# Patient Record
Sex: Male | Born: 1960 | Race: White | Hispanic: No | Smoking: Current every day smoker
Health system: Southern US, Community
[De-identification: ages and names within clinical notes are randomized; demographics above are authoritative.]

## PROBLEM LIST (undated history)

## (undated) DIAGNOSIS — N189 Chronic kidney disease, unspecified: Secondary | ICD-10-CM

## (undated) DIAGNOSIS — I10 Essential (primary) hypertension: Secondary | ICD-10-CM

## (undated) DIAGNOSIS — I8390 Asymptomatic varicose veins of unspecified lower extremity: Secondary | ICD-10-CM

## (undated) DIAGNOSIS — I82409 Acute embolism and thrombosis of unspecified deep veins of unspecified lower extremity: Secondary | ICD-10-CM

## (undated) DIAGNOSIS — I739 Peripheral vascular disease, unspecified: Secondary | ICD-10-CM

## (undated) DIAGNOSIS — R12 Heartburn: Secondary | ICD-10-CM

## (undated) DIAGNOSIS — I890 Lymphedema, not elsewhere classified: Secondary | ICD-10-CM

## (undated) DIAGNOSIS — I2510 Atherosclerotic heart disease of native coronary artery without angina pectoris: Secondary | ICD-10-CM

## (undated) DIAGNOSIS — F1020 Alcohol dependence, uncomplicated: Secondary | ICD-10-CM

## (undated) DIAGNOSIS — J449 Chronic obstructive pulmonary disease, unspecified: Secondary | ICD-10-CM

## (undated) HISTORY — PX: LOWER EXTREMITY ANGIOGRAM: SHX5955

## (undated) HISTORY — DX: Heartburn: R12

## (undated) HISTORY — PX: HERNIA REPAIR: SHX51

## (undated) HISTORY — DX: Acute embolism and thrombosis of unspecified deep veins of unspecified lower extremity: I82.409

---

## 2006-07-03 ENCOUNTER — Emergency Department: Payer: Self-pay

## 2008-10-21 ENCOUNTER — Observation Stay: Payer: Self-pay | Admitting: Internal Medicine

## 2009-05-02 ENCOUNTER — Emergency Department: Payer: Self-pay | Admitting: Internal Medicine

## 2011-03-19 ENCOUNTER — Encounter: Payer: Self-pay | Admitting: *Deleted

## 2011-03-19 ENCOUNTER — Emergency Department (HOSPITAL_BASED_OUTPATIENT_CLINIC_OR_DEPARTMENT_OTHER)
Admission: EM | Admit: 2011-03-19 | Discharge: 2011-03-19 | Disposition: A | Discharge status: Home / Self Care | Payer: Self-pay | Attending: Emergency Medicine | Admitting: Emergency Medicine

## 2011-03-19 DIAGNOSIS — H00019 Hordeolum externum unspecified eye, unspecified eyelid: Secondary | ICD-10-CM | POA: Insufficient documentation

## 2011-03-19 DIAGNOSIS — I10 Essential (primary) hypertension: Secondary | ICD-10-CM | POA: Insufficient documentation

## 2011-03-19 DIAGNOSIS — J449 Chronic obstructive pulmonary disease, unspecified: Secondary | ICD-10-CM | POA: Insufficient documentation

## 2011-03-19 DIAGNOSIS — J4489 Other specified chronic obstructive pulmonary disease: Secondary | ICD-10-CM | POA: Insufficient documentation

## 2011-03-19 DIAGNOSIS — F17200 Nicotine dependence, unspecified, uncomplicated: Secondary | ICD-10-CM | POA: Insufficient documentation

## 2011-03-19 HISTORY — DX: Essential (primary) hypertension: I10

## 2011-03-19 HISTORY — DX: Alcohol dependence, uncomplicated: F10.20

## 2011-03-19 HISTORY — DX: Chronic obstructive pulmonary disease, unspecified: J44.9

## 2011-03-19 MED ORDER — ERYTHROMYCIN 5 MG/GM OP OINT: TOPICAL_OINTMENT | OPHTHALMIC | Status: AC

## 2011-03-19 MED ORDER — NAPROXEN 500 MG PO TABS: 500.0000 mg | ORAL_TABLET | Freq: Two times a day (BID) | ORAL | Status: AC

## 2011-03-19 NOTE — ED Provider Notes (Signed)
History     CSN: [id number] Arrival date & time: 03/19/2011 10:27 AM   First MD Initiated Contact with Patient 03/19/11 1049      Chief Complaint  Patient presents with  . Eye Problem    (Consider location/radiation/quality/duration/timing/severity/associated sxs/prior treatment) Patient is a 50 y.o. male presenting with eye problem. The history is provided by the patient.  Eye Problem  This is a new problem. The current episode started 2 days ago. The problem occurs constantly. The problem has been gradually worsening. There is pain in the right eye. Injury mechanism: Patient was struck with a lady bug in the right eye approximately one week ago but the eye swelling and inflammation started just 2 days ago. Denies any eyeball pain . The pain is at a severity of 8/10. The pain is moderate. There is history of trauma to the eye. There is no known exposure to pink eye. He does not wear contacts. Associated symptoms include discharge. Pertinent negatives include no numbness, no blurred vision, no decreased vision, no double vision, no foreign body sensation, no photophobia, no eye redness, no nausea, no vomiting, no weakness and no itching. He has tried nothing for the symptoms.    Past Medical History  Diagnosis Date  . COPD (chronic obstructive pulmonary disease)   . Alcoholic   . Hypertension     History reviewed. No pertinent past surgical history.  No family history on file.  History  Substance Use Topics  . Smoking status: Current Everyday Smoker -- 1.0 packs/day  . Smokeless tobacco: Not on file  . Alcohol Use: Yes      Review of Systems  Constitutional: Negative for fever.  HENT: Negative for neck pain and neck stiffness.   Eyes: Positive for discharge. Negative for blurred vision, double vision, photophobia and redness.  Respiratory: Negative for shortness of breath.   Cardiovascular: Negative for chest pain.  Gastrointestinal: Negative for nausea, vomiting and  abdominal pain.  Genitourinary: Negative for hematuria.  Musculoskeletal: Negative for back pain.  Skin: Negative for itching and rash.  Neurological: Negative for weakness, numbness and headaches.  Hematological: Negative for adenopathy.    Allergies  Review of patient's allergies indicates no known allergies.  Home Medications   Current Outpatient Rx  Name Route Sig Dispense Refill  . ERYTHROMYCIN 5 MG/GM OP OINT  Place a 1/2 inch ribbon of ointment into the lower eyelid. 1 g 0  . NAPROXEN 500 MG PO TABS Oral Take 1 tablet (500 mg total) by mouth 2 (two) times daily. 14 tablet 0    BP 142/64  Pulse 66  Temp(Src) 98.7 F (37.1 C) (Oral)  Resp 22  SpO2 96%  Physical Exam  Nursing note and vitals reviewed. Constitutional: He is oriented to person, place, and time. He appears well-developed and well-nourished. No distress.  HENT:  Head: Normocephalic and atraumatic.  Mouth/Throat: Oropharynx is clear and moist.  Eyes: EOM are normal. Pupils are equal, round, and reactive to light. Right eye exhibits no discharge.       Eyes normal except for right eye lower lid protruding tenderness in rate area consistent with a sty. Conjunctiva is normal. Anterior chamber is normal. Corneas normal.  Neck: Normal range of motion. Neck supple.  Cardiovascular: Normal rate, regular rhythm and normal heart sounds.   No murmur heard. Pulmonary/Chest: Effort normal and breath sounds normal.  Abdominal: Soft. Bowel sounds are normal. There is no tenderness.  Musculoskeletal: Normal range of motion. He exhibits no edema.  Neurological: He is alert and oriented to person, place, and time. No cranial nerve deficit. He exhibits normal muscle tone. Coordination normal.  Skin: Skin is warm and dry. No rash noted.    ED Course  Procedures (including critical care time)  Labs Reviewed - No data to display No results found.   1. Stye       MDM   Right eye lower lid swelling and tenderness  consistent with stye. No evidence of foreign body corneas normal. Will treat with erythromycin ophthalmic ointment.         Shelda Jakes, MD 03/19/11 [phone]

## 2011-03-19 NOTE — ED Notes (Signed)
Visual acuity: right eye 20/70 left eye 20/50. Done without reading glasses.

## 2011-03-19 NOTE — ED Notes (Signed)
Pt states a bug flew in his right eye a week ago. Since that time he has had swelling under his eye. Swelling is worse in the mornings. No pain. No visual disturbances. Pt states he is presently a pt at [facility] for alcohol abuse.

## 2014-03-01 ENCOUNTER — Inpatient Hospital Stay: Payer: Self-pay | Admitting: Internal Medicine

## 2014-03-01 LAB — CBC
HCT: 46.8 % (ref 40.0–52.0)
HGB: 15 g/dL (ref 13.0–18.0)
MCH: 29.4 pg (ref 26.0–34.0)
MCHC: 32 g/dL (ref 32.0–36.0)
MCV: 92 fL (ref 80–100)
PLATELETS: 268 10*3/uL (ref 150–440)
RBC: 5.08 10*6/uL (ref 4.40–5.90)
RDW: 13.3 % (ref 11.5–14.5)
WBC: 9.8 10*3/uL (ref 3.8–10.6)

## 2014-03-01 LAB — BASIC METABOLIC PANEL
ANION GAP: 5 — AB (ref 7–16)
BUN: 14 mg/dL (ref 7–18)
CHLORIDE: 105 mmol/L (ref 98–107)
CO2: 30 mmol/L (ref 21–32)
CREATININE: 1.29 mg/dL (ref 0.60–1.30)
Calcium, Total: 8.1 mg/dL — ABNORMAL LOW (ref 8.5–10.1)
EGFR (Non-African Amer.): >60
GLUCOSE: 119 mg/dL — AB (ref 65–99)
Osmolality: 281 (ref 275–301)
Potassium: 4 mmol/L (ref 3.5–5.1)
SODIUM: 140 mmol/L (ref 136–145)

## 2014-03-01 LAB — TROPONIN I: Troponin-I: <0.02 ng/mL

## 2014-03-02 LAB — CBC WITH DIFFERENTIAL/PLATELET
BASOS ABS: 0 10*3/uL (ref 0.0–0.1)
BASOS PCT: 0.4 %
EOS ABS: 0 10*3/uL (ref 0.0–0.7)
EOS PCT: 0 %
HCT: 44.8 % (ref 40.0–52.0)
HGB: 14.8 g/dL (ref 13.0–18.0)
LYMPHS PCT: 9.1 %
Lymphocyte #: 1 10*3/uL (ref 1.0–3.6)
MCH: 29.8 pg (ref 26.0–34.0)
MCHC: 33.1 g/dL (ref 32.0–36.0)
MCV: 90 fL (ref 80–100)
Monocyte #: 0.2 x10 3/mm (ref 0.2–1.0)
Monocyte %: 2.2 %
NEUTROS ABS: 9.6 10*3/uL — AB (ref 1.4–6.5)
NEUTROS PCT: 88.3 %
PLATELETS: 267 10*3/uL (ref 150–440)
RBC: 4.97 10*6/uL (ref 4.40–5.90)
RDW: 12.9 % (ref 11.5–14.5)
WBC: 10.8 10*3/uL — AB (ref 3.8–10.6)

## 2014-03-02 LAB — BASIC METABOLIC PANEL
Anion Gap: 8 (ref 7–16)
BUN: 14 mg/dL (ref 7–18)
CALCIUM: 8.3 mg/dL — AB (ref 8.5–10.1)
CHLORIDE: 105 mmol/L (ref 98–107)
CREATININE: 1.24 mg/dL (ref 0.60–1.30)
Co2: 27 mmol/L (ref 21–32)
EGFR (Non-African Amer.): >60
GLUCOSE: 138 mg/dL — AB (ref 65–99)
Osmolality: 282 (ref 275–301)
Potassium: 3.9 mmol/L (ref 3.5–5.1)
SODIUM: 140 mmol/L (ref 136–145)

## 2014-03-03 LAB — CBC WITH DIFFERENTIAL/PLATELET
Basophil #: 0 10*3/uL (ref 0.0–0.1)
Basophil %: 0.3 %
EOS ABS: 0 10*3/uL (ref 0.0–0.7)
EOS PCT: 0.1 %
HCT: 44.5 % (ref 40.0–52.0)
HGB: 14.2 g/dL (ref 13.0–18.0)
LYMPHS ABS: 0.8 10*3/uL — AB (ref 1.0–3.6)
LYMPHS PCT: 6 %
MCH: 29 pg (ref 26.0–34.0)
MCHC: 31.8 g/dL — AB (ref 32.0–36.0)
MCV: 91 fL (ref 80–100)
MONO ABS: 0.6 x10 3/mm (ref 0.2–1.0)
MONOS PCT: 4.3 %
NEUTROS ABS: 12.6 10*3/uL — AB (ref 1.4–6.5)
NEUTROS PCT: 89.3 %
PLATELETS: 262 10*3/uL (ref 150–440)
RBC: 4.87 10*6/uL (ref 4.40–5.90)
RDW: 13.5 % (ref 11.5–14.5)
WBC: 14.1 10*3/uL — AB (ref 3.8–10.6)

## 2014-03-03 LAB — BASIC METABOLIC PANEL
ANION GAP: 7 (ref 7–16)
BUN: 17 mg/dL (ref 7–18)
CHLORIDE: 103 mmol/L (ref 98–107)
Calcium, Total: 8 mg/dL — ABNORMAL LOW (ref 8.5–10.1)
Co2: 28 mmol/L (ref 21–32)
Creatinine: 1.3 mg/dL (ref 0.60–1.30)
EGFR (African American): >60
Glucose: 141 mg/dL — ABNORMAL HIGH (ref 65–99)
Osmolality: 280 (ref 275–301)
POTASSIUM: 4.1 mmol/L (ref 3.5–5.1)
SODIUM: 138 mmol/L (ref 136–145)

## 2014-03-05 LAB — HEMOGLOBIN A1C: Hemoglobin A1C: 6.2 % (ref 4.2–6.3)

## 2014-07-31 ENCOUNTER — Ambulatory Visit: Payer: Self-pay | Admitting: Vascular Surgery

## 2014-08-07 ENCOUNTER — Ambulatory Visit: Payer: Self-pay | Admitting: Vascular Surgery

## 2014-09-04 ENCOUNTER — Ambulatory Visit
Admit: 2014-09-04 | Disposition: A | Discharge status: Home / Self Care | Payer: Self-pay | Attending: Vascular Surgery | Admitting: Vascular Surgery

## 2014-09-04 LAB — CREATININE, SERUM
CREATININE: 1.36 mg/dL — AB
GFR CALC NON AF AMER: 58 — AB

## 2014-09-04 LAB — BUN: BUN: 24 mg/dL — ABNORMAL HIGH

## 2014-09-08 NOTE — H&P (Signed)
PATIENT NAME:  Vincent [name], Vincent [name] MR#:  [id number] DATE OF BIRTH:  [birth year]  DATE OF ADMISSION:  03/01/2014  PRIMARY CARE PHYSICIAN: At the [facility].   CHIEF COMPLAINT: Shortness of breath.   HISTORY OF PRESENT ILLNESS: This is a 54 year old male who presents to the Emergency Room due to worsening shortness of breath and cough getting worse over the past week or so. The patient went to see his primary care physician this past Monday and was given a nebulizer treatment and started on a prednisone taper along with Levaquin despite taking the prednisone and Levaquin, the patient's shortness of breath has gotten progressively worse where he has significant dyspnea at minimal exertion or even at rest. He also admits to a cough which is intermittently productive with sputum. He admits to some chills but no documented fever. No nausea, no vomiting. Positive chest tightness due to shortness of breath. Due to the fact that his symptoms are not improving, he came to the ER for further evaluation. He was noted to be hypoxic at triage and noted to have significant bronchospasm and wheezing. Having failed outpatient therapy for COPD, hospitalist services were contacted for further treatment and evaluation for his COPD exacerbation.   REVIEW OF SYSTEMS:   CONSTITUTIONAL: No documented fever. No weight gain, no weight loss.  EYES: No blurry or double vision.  ENT: No tinnitus. No postnasal drip. No redness of the oropharynx.  RESPIRATORY: Positive cough. Positive wheeze. No hemoptysis. Positive dyspnea.  CARDIOVASCULAR: No chest pain, no orthopnea, no palpitation, no syncope.  GASTROINTESTINAL: No nausea, no vomiting, diarrhea, no abdominal pain. No melena or hematochezia.   GENITOURINARY: No dysuria or hematuria.  ENDOCRINE: No polyuria or nocturia, heat or cold intolerance.  HEMATOLOGIC: No anemia. No bruising. No bleeding.  INTEGUMENTARY: No rashes. No lesions.  MUSCULOSKELETAL: No arthritis, no  swelling, no gout.  NEUROLOGIC: No numbness or tingling. No ataxia. No seizure-type activity.  PSYCHIATRIC: No anxiety. No insomnia. No ADD.  PAST MEDICAL HISTORY: Consistent with COPD with ongoing tobacco abuse, hypertension, GERD, allergic rhinitis.   ALLERGIES: No known drug allergies.   SOCIAL HISTORY: Still smokes about a half a pack per day, has been smoking for the past 40+ years. Also has a history of heavy alcohol abuse, but has been clean for the past 3 years.   No illicit drug abuse.   FAMILY HISTORY: Mother and father are both deceased. Both had COPD. Mother has died from complications of heart disease and so did the father.   CURRENT MEDICATIONS: Atrovent 2 puffs 4 times daily, Depo-testosterone 1 mL intramuscular every 2 weeks. Flonase 1 spray to each nostril daily, hydrochlorothiazide/lisinopril 12.5/20 one tablet daily, Levaquin 5 mg daily, omeprazole 20 mg daily, prednisone taper, albuterol inhaler 2 puffs q. 4-6 hours as needed, Qvar 80 mcg inhaled 1 puff b.i.d., Viagra 50 mg daily as needed, and Zyrtec 10 mg daily.   PHYSICAL EXAMINATION: Presently is as follows:  VITALS SIGNS: Are noted to be: Temperature is 98.1, pulse 69, respirations 22, blood pressure 182/91, saturations 94% on 2 liters nasal cannula.  GENERAL: He is a pleasant-appearing male in mild respiratory distress. HEAD, EYES, EARS, NOSE AND THROAT: Atraumatic, normocephalic. Extraocular muscles are intact. Pupils equal and reactive to light. Sclerae anicteric. No conjunctival injection. No pharyngeal erythema.  NECK: Supple. There is no jugular venous distention. No bruits, no lymphadenopathy or thyromegaly.  HEART: Regular rate and rhythm. No murmurs. No rubs, no clicks, tachycardic.  LUNGS: He has coarse rhonchi  and diffuse inspiratory and expiratory wheezing, positive use of accessory muscles. No dullness to percussion.  ABDOMEN: Soft, flat, nontender, nondistended. Has good bowel sounds. No hepatosplenomegaly  appreciated.  EXTREMITIES: No evidence of any cyanosis, clubbing, or peripheral edema. Has +2 pedal and radial pulses bilaterally.  NEUROLOGIC: The patient is alert, awake and oriented x 3 with no focal motor or sensory deficits appreciated bilaterally.  SKIN: Moist and warm with no rashes appreciated.  LYMPHATIC: There is no cervical or axillary adenopathy.   LABORATORY AND RADIOLOGICAL DATA: Serum glucose of 119, BUN 14, creatinine 1.2, sodium 140, potassium 4, chloride 105, bicarbonate 30. Troponin less than 0.02. White cell count 9.8, hemoglobin 15, hematocrit 46.8, platelet count 268,000.   The patient had a chest x-ray done which showed no acute cardiopulmonary disease, a 9-mm nodular opacity in the right lower lung which may represent a nipple shadow versus true pulmonary nodule. Further followup is recommended.   ASSESSMENT AND PLAN: This is a 54 year old male with a history of chronic obstructive pulmonary disease with ongoing tobacco abuse, hypertension, gastroesophageal reflux disease, allergic rhinitis, who presents to the hospital with shortness of breath getting worse along with a cough and having failed outpatient therapy with oral prednisone and Levaquin for chronic obstructive pulmonary disease exacerbation.   PROBLEMS:  1. Chronic obstructive pulmonary disease exacerbation. The patient has failed outpatient therapy with oral prednisone and Levaquin as his symptoms have progressively gotten worse and he is noted to be more hypoxic. The cause of the chronic obstructive pulmonary disease exacerbation is likely secondary to ongoing tobacco abuse complicated with underlying acute bronchitis. I will admit the patient, start him on IV steroids, around-the-clock nebulizer treatments, also start him on some Advair and some Spiriva empirically. Also give him ceftriaxone and Zithromax. Follow sputum cultures. The patient likely needs to be assessed for home oxygen prior to discharge.  2.  Hypertension. The patient is hemodynamically stable. Continue his lisinopril/hydrochlorothiazide. 3. Gastroesophageal reflux disease. Continue Protonix.  4. Allergic rhinitis. Continue Flonase. 5. Nicotine dependence, place him on a nicotine patch.   CODE STATUS: The patient is a full code.   TIME SPENT ON ADMISSION: 45 minutes.     ____________________________ Rolly PancakeVivek J. Cherlynn KaiserSainani, MD vjs:lt D: 03/01/2014 17:48:50 ET T: 03/01/2014 21:01:43 ET JOB#: [id number]  cc: Rolly PancakeVivek J. Cherlynn KaiserSainani, MD, <Dictator> Houston SirenVIVEK J Loreena Valeri MD ELECTRONICALLY SIGNED 03/26/2014 10:58

## 2014-09-08 NOTE — Discharge Summary (Signed)
PATIENT NAME:  Vincent [name], Vincent [name] MR#:  [id number] DATE OF BIRTH:  October 24, 1960  DATE OF ADMISSION:  03/01/2014 DATE OF DISCHARGE:  03/05/2014  ADMITTING DIAGNOSES: Chronic obstructive pulmonary disease exacerbation, hypertension, gastroesophageal reflux disease, allergic rhinitis and nicotine dependence.   DISCHARGE DIAGNOSES: 1.  Acute on chronic likely respiratory failure with hypoxia.  2.  Chronic obstructive pulmonary disease exacerbation. 3.  Nicotine dependence.  4.  Acute bronchitis.  5.  Suspected peripheral vascular disease. 6.  History of hypertension, gastroesophageal reflux disease, allergic rhinitis, right inferior orbital area soft tissue mass of unclear etiology.   DISCHARGE CONDITION: Stable.   DISCHARGE MEDICATIONS: The patient is to continue: 1.  Levaquin 500 mg p.o. daily to complete 10 day course. 2.  HCTZ/lisinopril 12.5/20 mg one daily. 3.  Depo-Testosterone 1 mL intramuscular every 2 weeks. 4.  Viagra 50 mg once daily as needed. 5.  Zyrtec 10 mg p.o. daily. 6.  Flonase 1 spray once daily. 7.  Omeprazole 20 mg p.o. daily. 8.  Atrovent 2 puffs 4 times daily. 9.  ProAir HFA 2 puffs every 4 to 6 hours as needed. 10.  Q-Var 1 puff twice daily. 11.  Prednisone taper at 50 mg p.o. once on the 20th of October 2015 then taper x10 mg every 2 days and until stopped. 12.  Aspirin 81 mg p.o. daily.  13.  Albuterol ipratropium 2.5/0.5 mg in 3 mL inhalation solution one dose 6 times daily as needed.  14.  Tiotropium 1 inhalation once daily.  15.  Guaifenesin 600 mg p.o. twice daily. 16.  Guaifenesin liquid 5 mL every 4 hours as needed.  17.  Nicotine oral inhaler 1 inhalation every 1 to 2 hours as needed. 18.  Nicotine transdermal patch 14 mg topically daily.   HOME OXYGEN: None.   DIET: Two grams salt, low fat, low cholesterol, regular consistency.   ACTIVITY LIMITATIONS: As tolerated.  DISCHARGE FOLLOWUP: [facility] 2 days after  discharge, Dr. Freda MunroSaadat Khan from pulmonary in 1 week after discharge, also Dr. Wyn Quakerew in 1 week after discharge. The patient was also advised to stop smoking since he is slowly killing himself.  CONSULTANTS: Care management, social work.   DIAGNOSTIC DATA: Radiologic studies: Chest x-ray, PA and lateral, 15th of October 2015, revealed no active cardiopulmonary disease, 9 mm nodular opacity in the right lower lung which may represent a nipple shadow versus a true pulmonary nodule. Recommend further evaluation with nipple markers. Repeated chest x-ray, PA and lateral, 19th of October 2015, revealed nipple marker view, confirmed nodular density in the right lung base represents nipple shadow, chronic lung changes without segmental infiltrate or extremity edema. Calcified aorta. Substernal nodular density, unchanged from 2008. Mild degenerative changes in the thoracic spine. Bilateral carotid bifurcation calcifications, advanced for the patient's age.  HISTORY AND HOSPITAL COURSE: The patient is a 54 year old Caucasian male with past medical history significant for history of COPD who presented to the hospital with complaints of shortness of breath. Please refer to Dr. Hilbert OdorSainani's admission note on the 15th of October 2015. Apparently the patient was having problems with cough as well as shortness of breath. He was seen by his primary care physician, was started on Levaquin as well as prednisone taper; however, he worsened and came to the Emergency Room for further evaluation. In the Emergency Room, he was noted to be hypoxic with O2 sats dropping down to 88% on room air. He was admitted to the hospital for further evaluation. He  was initiated on Rocephin as well as Zithromax while he was in the hospital as well as IV steroids and nebulizing therapy and inhalers, aggressive therapy. With this therapy, medical therapy, he significantly improved. He was able to ambulate on room air on exertion with no significant  desaturations, on the 19th of October 2015, with O2 sats staying steady at around 92%. The patient was advised to continue steroids as well as nebulizing therapy and antibiotics to complete the course. We attempted to get sputum, however, the patient was not expectorating significantly.   In regards to nicotine dependence, we counseled the patient about stopping tobacco and nicotine replacement therapy was initiated.   The patient was suspected to have peripheral vascular disease and admitted of claudication.  His dorsalis pedis pulses bilaterally were very diminished. We recommend vascular follow-up as an outpatient. For now we will continue aspirin therapy, but overall the patient would benefit from Plavix. No interventions were performed while he was in the hospital, however.   Regarding hypertension, the patient is to continue lisinopril as well as HCTZ.  In regards to gastroesophageal reflux disease, he is to continue his PPIs.  For allergic rhinitis, he is recommended to continue Flonase and stop smoking.   He is being discharged in stable condition with the above-mentioned medications and follow-up. On the day of discharge, the patient's vital signs: Temperature was 98.7, pulse was 73, respiration rate was 20, blood pressure 126/66, and saturation was 90 to 92% on room air at rest as well as on exertion.   TIME SPENT: 40 minutes.  ____________________________ Katharina Caper, MD rv:sb [name]: 03/05/2014 15:03:00 ET T: 03/05/2014 17:08:49 ET JOB#: [id number]  cc: Katharina Caper, MD, <Dictator> Phineas Real [facility] Tasheba Henson MD ELECTRONICALLY SIGNED 03/16/2014 11:42

## 2014-09-16 NOTE — Op Note (Signed)
PATIENT NAME:  Vincent [name], Vincent [name] MR#:  [id number] DATE OF BIRTH:  1960/08/09  DATE OF PROCEDURE:  08/07/2014  PREOPERATIVE DIAGNOSES:  1.  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  2.  Hypertension.  3.  Tobacco abuse.   POSTOPERATIVE DIAGNOSES:  1.  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  2.  Hypertension.  3.  Tobacco abuse.   PROCEDURE PERFORMED:  1.  Left lower extremity angiography, third order catheter placement.  2.  Crosser atherectomy left superficial femoral artery, unsuccessful.   SURGEON: Renford DillsGregory G Mahaila Tischer, M.[name].   SEDATION:  Versed plus fentanyl.   CONTRAST USED: Isovue 45 mL.   FLUOROSCOPY TIME: 20.3 minutes.   ACCESS:  A 7 French sheath, right common femoral artery.   INDICATIONS: Mr. 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 This was successful and the patient has noticed marked improvement. He is now returning for treatment of his left.  The risks and benefits have been reviewed. The patient has agreed to proceed.   DESCRIPTION OF PROCEDURE: The patient is taken to special procedures and placed in the supine position. After adequate sedation is achieved, the right groin is prepped and draped in a sterile fashion. Ultrasound is placed in a sterile sleeve. Ultrasound is utilized secondary to lack of appropriate landmarks and to avoid vascular injury. Under real-time visualization, access is taken to the common femoral artery. Glidewire and J-wire is then advanced up and over the bifurcation and the 5-French sheath is exchanged for a 7-French Ansell sheath. With the [facility] sheath located in the profunda femoris, hand injection of contrast is then utilized to demonstrate the distal runoff. After  review of these images, 4000 units of heparin is given.  A Crosser 14S device is prepped on the back table.  The sheath is repositioned so the tip is above the area of the cul-de-sac which is quite small in the SFA and as a steep LAO projection of the groin is obtained under magnified imaging. Subsequently, the Crosser is advanced down to the level of the above-knee popliteal; however, imaging demonstrates that is not intraluminal. The microcatheter is exchanged for a KMP catheter and attempts at crossing into the true lumen with the Glidewire are unsuccessful as well.  The procedure is terminated at 20 minutes of fluoroscopy. The sheath is pulled into the right external iliac. Oblique view is obtained and a StarClose device deployed. There are no complications with closure.   INTERPRETATION: The left external iliac and common femoral profunda femoris are widely patent. There is reconstitution of the above-knee popliteal and the popliteal is patent down to a complete trifurcation. There is patency of the posterior tibial and down filling the foot. The anterior tibial appears to be patent down to the foot, but is fairly slow.  Peroneal is quite small and does not fill down to the ankle.   Crosser atherectomy does not attain re-entry and therefore no further intervention is performed at this time.    ____________________________ Renford DillsGregory G. Ramiya Delahunty, MD ggs:sp [name]: 08/07/2014 09:49:50 ET T: 08/07/2014 11:35:29 ET JOB#: [id number]  cc: Renford DillsGregory G. Bryten Maher, MD, <Dictator> Renford DillsGREGORY G Ludger Bones MD ELECTRONICALLY SIGNED 08/28/2014 15:09

## 2014-09-16 NOTE — Op Note (Signed)
PATIENT NAME:  Vincent [name], Vincent [name]#:  [id number] DATE OF BIRTH:  November 12, 1960  DATE OF PROCEDURE:  07/31/2014  PREOPERATIVE DIAGNOSES:  1.  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  Hypertension.  3.  Tobacco abuse.   POSTOPERATIVE DIAGNOSES:  1.  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  Hypertension.  3.  Tobacco abuse.   PROCEDURE PERFORMED:  1.  Abdominal aortogram.  2.  Right lower extremity distal runoff, third order catheter placement.  3.  Crosser atherectomy. 4.  Percutaneous transluminal angioplasty of the right SFA to a maximal diameter of 6 mm with Lutonix balloons.   SURGEON: Levora DredgeGregory Schnier, M.D.   SEDATION:  Versed plus fentanyl.   CONTRAST USED: Isovue 78 mL.   FLUOROSCOPY TIME: 8.9 minutes.   INDICATIONS: [name]. Darcella GasmanDodson is a 54 year old gentleman who is still working and is finding it increasingly difficult to perform his job secondary to short distance claudication. He has tried conservative therapies, but these have failed, and he is therefore asking for revascularization. Angiography with intervention is being performed with the hope of re-establishing more normal perfusion. Risks and benefits have been reviewed. All questions answered. The patient agrees to proceed.   DESCRIPTION OF PROCEDURE: The patient is taken to the special procedure suite, placed in supine position. After adequate sedation is achieved, both groins are prepped and draped in a sterile fashion. Lidocaine 1% is infiltrated in the soft tissues over the left groin. Ultrasound is placed in a sterile sleeve. Common femoral artery is identified. It is echolucent and pulsatile indicating patency. Image is recorded for the permanent record under real-time visualization.  Access is obtained to the common femoral artery.  Micro wire, micro sheath, J-wire followed by a 5 French sheath and 5  French pigtail catheter.   The pigtail catheter is positioned at level of T12 and AP projection of the aorta is obtained. Pigtail catheter is repositioned to above the bifurcation and an LAO projection of the pelvis is obtained. Stiff angled Glidewire and pigtail catheter are then used to cross the bifurcation and with the pigtail catheter in the distal external iliac an RAO projection of the groin is obtained. The catheter and wire are then advanced into the profunda and distal runoff is obtained.   Heparin 4000 units given. A stiff angled Glidewire is reintroduced and a 7 [place]French Ansell sheath is advanced up and over the bifurcation under fluoroscopic guidance. Using an angled sidekick catheter and a 14S crosser device, the cul-de-sac of the SFA is engaged, and subsequently the crosser catheter is advanced down to the level of Hunter canal.  Hand injection of contrast demonstrates that there is a dissection plane; however, there is filling of the true lumen and exchanging the sidekick catheter over a Glidewire for a Kumpe catheter, the wire and Kumpe catheter are then advanced into the true lumen.  Hand injection of contrast at the level of the femoral condyle stent demonstrates intraluminal positioning with preservation of distal runoff compared to the earlier images.  A 260 Magic Torque wire is then advanced down and a 5 x 15 Lutonix balloon is used to angioplasty the SFA from Hunter's canal more proximally.  A second 5 x 15 balloon is also used and this one extends into the common femoral by approximately 1 cm.  Follow-up imaging now demonstrates patency of the right SFA. However, at the origin there is a 3 cm long segment of greater than 50% residual  stenosis. There is also an irregularity somewhat farther down. Because of these two 2 lesions, a 6 x 4 balloon is opened onto the field, advanced across these 2 lesions, and separate angioplasties are performed of both these locations to 6-8 atmospheres for  approximately 2 minutes. Follow-up imaging demonstrates there is some residual stenosis proximally just below the bifurcation; however, this does not appear to be flow-limiting at this point.  There is no dissection associated with this lesion and given that it is right up to the origin and the patient is 54 years old, I have elected not to proceed with any stenting at this time. There appears to be rapid flow of contrast which is unimpeded through this area. The more distal lesion is well treated, no longer an issue.  More distal runoff now is obtained and this demonstrates that the distal SFA and the above-knee popliteal are widely patent. No evidence of hemodynamically significant lesions. Trifurcation is widely patent. There is preservation of runoff to the foot.   The sheath is pulled into the left external iliac, oblique view is obtained, and a Mynx device is deployed with excellent result.   INTERPRETATION: The abdominal aorta is opacified with a bolus injection of contrast.  The aorta, bilateral common, and bilateral external iliac arteries are imaged.  They are noted for some mild calcific disease, but there are no hemodynamically significant lesions.   The common femoral and profunda femoris on the right are widely patent. There is a small cul-de-sac representing the origin of the SFA which then subsequently occludes and remains occluded down to [facility] canal. The at-knee popliteal and distal popliteal are reconstituted and are widely patent and free of hemodynamically significant lesions. The trifurcation is patent and the anterior tibial and posterior tibial are patent to the ankle.   Following successful crosser atherectomy, there is verification of intraluminal positioning, and subsequently angioplasty is performed to a maximal diameter of 6 mm in the SFA with an excellent result.   SUMMARY: Successful recanalization of the occluded SFA on the right.    ____________________________ Renford Dills, MD ggs:sp D: 07/31/2014 12:26:44 ET T: 07/31/2014 13:00:47 ET JOB#: [id number]  cc: Renford Dills, MD, <Dictator> Renford Dills MD ELECTRONICALLY SIGNED 08/28/2014 15:08

## 2014-09-16 NOTE — Op Note (Signed)
PATIENT NAME:  [name], [name]:  [id number] DATE OF BIRTH:  10/20/1960  DATE OF PROCEDURE:  09/04/2014  PREOPERATIVE DIAGNOSES: 1.  Atherosclerotic occlusive disease bilateral lower extremities with lifestyle limiting claudication, left lower extremity.  2.  Occlusion left superficial femoral artery.  POSTOPERATIVE DIAGNOSES:   1.  Atherosclerotic occlusive disease bilateral lower extremities with lifestyle limiting claudication, left lower extremity.  2.  Occlusion left superficial femoral artery.  PROCEDURES PERFORMED: 1.  Left lower extremity angiography, third order catheter placement.  2.  Crosser atherectomy, left superficial femoral artery and popliteal.  3.  Percutaneous transluminal angioplasty and stent placement, left superficial femoral artery.   SURGEON: Levora Dredge, M.D.   SEDATION: Versed plus fentanyl. Continuous ECG, pulse oximetry and cardiopulmonary monitoring is performed throughout the entire procedure by the interventional radiology nurse. Total sedation time is 1 hour and 40 minutes.   ACCESS: A 7-French sheath, right common femoral artery.   FLUOROSCOPY TIME: 10.8 minutes.   CONTRAST USED: Isovue 55 mL.   INDICATIONS: Vincent [name] is a 54 year old gentleman who presented to the office with bilateral lower extremity pain consistent with claudication. Physical examination as well as noninvasive studies supported this. The patient is still working but has been having problems completing his job assignments secondary to his leg pain and is concerned that he is at risk for losing his job secondary to his inability to walk. The risks and benefits for angiography and intervention are reviewed. All questions answered. The patient agrees to proceed.   DESCRIPTION OF PROCEDURE: The patient is taken to special procedures and placed in the supine position. After adequate sedation is achieved, both groins are prepped and draped in sterile fashion. Ultrasound is placed in  a sterile sleeve. Ultrasound is utilized secondary to lack of appropriate landmarks and to avoid vascular injury. Under real-time visualization, the common femoral artery is accessed, after 1% lidocaine has been infiltrated. Microneedle is inserted, microwire followed by microsheath, J-wire followed by a 5-French sheath.   Rim catheter is then advanced to the aortic bifurcation which is visible under fluoroscopy secondary to the calcifications. Bifurcation is then hooked and the Glidewire is advanced down into the profunda. The catheter is then advanced.   The patient is then given 4000 units of heparin. A 7-French Ansell sheath is advanced up and over the bifurcation and positioned with the common femoral. An 0.018 V18 wire is then advanced down to the stump of the SFA and an angled Usher catheter is advanced. Crosser S6 device is then advanced down and atherectomy is performed. Using the combination of the catheter with the S6 device, the catheter and crosser are negotiated down to the above-knee popliteal. Hand injection of contrast demonstrates intraluminal placement, in the mid popliteal, and the V18 wire is introduced. A 4-French straight catheter is then advanced over the wire and a VersaCore wire is exchanged for the V18.   Beginning distally the Lutonix balloons are used to angioplasty the SFA and above-knee popliteal. A total of 3 balloons were used to cross the entire occluded segment. Two 5 x 15s and one 5 x 6. All inflations were to 10 atmospheres for 3 minutes. Follow-up imaging demonstrates critical residual stenosis in the midportion of the SFA as well as at the origin. Subsequently, 2 Life stents are deployed. These are both 6 x 40s and they are postdilated with a 6 mm balloon. Follow-up angiography now demonstrates wide patency of the SFA/popliteal with preservation of the distal runoff.   The  sheath is pulled into the external iliac and oblique view is obtained on the right, a StarClose  device deployed, and there are no immediate complications.   INTERPRETATION: The left common femoral is imaged as is the profunda. There is a 60 to 70% diameter reduction at the origin of the profunda. Otherwise, the profunda is patent. SFA occludes approximally 5 to 6 mm from its origin so there is a small cul-de-sac. It reconstitutes the above-knee popliteal just [facility] and there is 3 vessel runoff with the posterior tibial being dominant.   Following crosser atherectomy, verification of intraluminal placement is made through the straight catheter and subsequently angioplasty is performed and then stent placement in 2 separate locations.   SUMMARY: Successful recanalization of the left lower extremity SFA with Crosser atherectomy and subsequent angioplasty and stent placement, as described above.  ____________________________ Renford DillsGregory G. Ilaisaane Marts, MD ggs:sb D: 09/04/2014 10:09:52 ET T: 09/04/2014 11:42:52 ET JOB#: [id number]  cc: Renford DillsGregory G. Guthrie Lemme, MD, <Dictator> Renford DillsGREGORY G Lori Liew MD ELECTRONICALLY SIGNED 09/04/2014 12:56

## 2015-09-16 ENCOUNTER — Other Ambulatory Visit: Payer: Self-pay | Admitting: Vascular Surgery

## 2015-09-30 ENCOUNTER — Encounter
Admission: RE | Disposition: A | Discharge status: Home / Self Care | Payer: Self-pay | Source: Ambulatory Visit | Attending: Vascular Surgery

## 2015-09-30 ENCOUNTER — Ambulatory Visit
Admission: RE | Admit: 2015-09-30 | Discharge: 2015-09-30 | Disposition: A | Discharge status: Home / Self Care | Payer: BLUE CROSS/BLUE SHIELD | Source: Ambulatory Visit | Attending: Vascular Surgery | Admitting: Vascular Surgery

## 2015-09-30 ENCOUNTER — Encounter: Payer: Self-pay | Admitting: *Deleted

## 2015-09-30 DIAGNOSIS — I129 Hypertensive chronic kidney disease with stage 1 through stage 4 chronic kidney disease, or unspecified chronic kidney disease: Secondary | ICD-10-CM | POA: Diagnosis not present

## 2015-09-30 DIAGNOSIS — Z79899 Other long term (current) drug therapy: Secondary | ICD-10-CM | POA: Insufficient documentation

## 2015-09-30 DIAGNOSIS — J449 Chronic obstructive pulmonary disease, unspecified: Secondary | ICD-10-CM | POA: Insufficient documentation

## 2015-09-30 DIAGNOSIS — I739 Peripheral vascular disease, unspecified: Secondary | ICD-10-CM | POA: Insufficient documentation

## 2015-09-30 DIAGNOSIS — Z7951 Long term (current) use of inhaled steroids: Secondary | ICD-10-CM | POA: Diagnosis not present

## 2015-09-30 DIAGNOSIS — Z841 Family history of disorders of kidney and ureter: Secondary | ICD-10-CM | POA: Diagnosis not present

## 2015-09-30 DIAGNOSIS — Z9862 Peripheral vascular angioplasty status: Secondary | ICD-10-CM | POA: Insufficient documentation

## 2015-09-30 DIAGNOSIS — R12 Heartburn: Secondary | ICD-10-CM | POA: Insufficient documentation

## 2015-09-30 DIAGNOSIS — F17210 Nicotine dependence, cigarettes, uncomplicated: Secondary | ICD-10-CM | POA: Diagnosis not present

## 2015-09-30 DIAGNOSIS — I8390 Asymptomatic varicose veins of unspecified lower extremity: Secondary | ICD-10-CM | POA: Diagnosis not present

## 2015-09-30 DIAGNOSIS — Z800 Family history of malignant neoplasm of digestive organs: Secondary | ICD-10-CM | POA: Insufficient documentation

## 2015-09-30 DIAGNOSIS — N189 Chronic kidney disease, unspecified: Secondary | ICD-10-CM | POA: Insufficient documentation

## 2015-09-30 DIAGNOSIS — I2510 Atherosclerotic heart disease of native coronary artery without angina pectoris: Secondary | ICD-10-CM | POA: Insufficient documentation

## 2015-09-30 DIAGNOSIS — Z86718 Personal history of other venous thrombosis and embolism: Secondary | ICD-10-CM | POA: Diagnosis not present

## 2015-09-30 DIAGNOSIS — Z7982 Long term (current) use of aspirin: Secondary | ICD-10-CM | POA: Insufficient documentation

## 2015-09-30 DIAGNOSIS — I890 Lymphedema, not elsewhere classified: Secondary | ICD-10-CM | POA: Diagnosis not present

## 2015-09-30 DIAGNOSIS — I70219 Atherosclerosis of native arteries of extremities with intermittent claudication, unspecified extremity: Secondary | ICD-10-CM | POA: Insufficient documentation

## 2015-09-30 HISTORY — DX: Asymptomatic varicose veins of unspecified lower extremity: I83.90

## 2015-09-30 HISTORY — DX: Lymphedema, not elsewhere classified: I89.0

## 2015-09-30 HISTORY — DX: Chronic kidney disease, unspecified: N18.9

## 2015-09-30 HISTORY — DX: Atherosclerotic heart disease of native coronary artery without angina pectoris: I25.10

## 2015-09-30 HISTORY — PX: PERIPHERAL VASCULAR CATHETERIZATION: SHX172C

## 2015-09-30 HISTORY — DX: Peripheral vascular disease, unspecified: I73.9

## 2015-09-30 LAB — CREATININE, SERUM
Creatinine, Ser: 1.32 mg/dL — ABNORMAL HIGH (ref 0.61–1.24)
GFR calc non Af Amer: 59 mL/min — ABNORMAL LOW (ref >60)

## 2015-09-30 LAB — BUN: BUN: 23 mg/dL — AB (ref 6–20)

## 2015-09-30 SURGERY — LOWER EXTREMITY ANGIOGRAPHY
Anesthesia: Moderate Sedation | Laterality: Left

## 2015-09-30 MED ORDER — MIDAZOLAM HCL 5 MG/5ML IJ SOLN
INTRAMUSCULAR | Status: AC
  Filled 2015-09-30: qty 5

## 2015-09-30 MED ORDER — MIDAZOLAM HCL 2 MG/2ML IJ SOLN
INTRAMUSCULAR | Status: DC | PRN
  Administered 2015-09-30 (×2): 2 mg via INTRAVENOUS

## 2015-09-30 MED ORDER — ACETAMINOPHEN 325 MG PO TABS: 325.0000 mg | ORAL_TABLET | ORAL | Status: DC | PRN

## 2015-09-30 MED ORDER — CLOPIDOGREL BISULFATE 75 MG PO TABS: 75.0000 mg | ORAL_TABLET | Freq: Every day | ORAL | Status: AC

## 2015-09-30 MED ORDER — LIDOCAINE HCL (PF) 1 % IJ SOLN
INTRAMUSCULAR | Status: AC
  Filled 2015-09-30: qty 30

## 2015-09-30 MED ORDER — HEPARIN (PORCINE) IN NACL 2-0.9 UNIT/ML-% IJ SOLN
INTRAMUSCULAR | Status: AC
  Filled 2015-09-30: qty 1000

## 2015-09-30 MED ORDER — FENTANYL CITRATE (PF) 100 MCG/2ML IJ SOLN
INTRAMUSCULAR | Status: DC | PRN
  Administered 2015-09-30 (×2): 50 ug via INTRAVENOUS

## 2015-09-30 MED ORDER — FAMOTIDINE 20 MG PO TABS: 40.0000 mg | ORAL_TABLET | ORAL | Status: DC | PRN

## 2015-09-30 MED ORDER — HYDROMORPHONE HCL 1 MG/ML IJ SOLN: 0.5000 mg | INTRAMUSCULAR | Status: DC | PRN

## 2015-09-30 MED ORDER — METOPROLOL TARTRATE 5 MG/5ML IV SOLN: 2.0000 mg | INTRAVENOUS | Status: DC | PRN

## 2015-09-30 MED ORDER — DEXTROSE 5 % IV SOLN
1.5000 g | INTRAVENOUS | Status: AC
  Administered 2015-09-30: 1.5 g via INTRAVENOUS

## 2015-09-30 MED ORDER — LIDOCAINE-EPINEPHRINE (PF) 1 %-1:200000 IJ SOLN
INTRAMUSCULAR | Status: DC | PRN
  Administered 2015-09-30: 10 mL via INTRADERMAL

## 2015-09-30 MED ORDER — ACETAMINOPHEN 325 MG RE SUPP: 325.0000 mg | RECTAL | Status: DC | PRN

## 2015-09-30 MED ORDER — HEPARIN SODIUM (PORCINE) 1000 UNIT/ML IJ SOLN
INTRAMUSCULAR | Status: DC | PRN
  Administered 2015-09-30: 5000 [IU] via INTRAVENOUS

## 2015-09-30 MED ORDER — METHYLPREDNISOLONE SODIUM SUCC 125 MG IJ SOLR: 125.0000 mg | INTRAMUSCULAR | Status: DC | PRN

## 2015-09-30 MED ORDER — GUAIFENESIN-DM 100-10 MG/5ML PO SYRP: 15.0000 mL | ORAL_SOLUTION | ORAL | Status: DC | PRN

## 2015-09-30 MED ORDER — ONDANSETRON HCL 4 MG/2ML IJ SOLN: 4.0000 mg | Freq: Four times a day (QID) | INTRAMUSCULAR | Status: DC | PRN

## 2015-09-30 MED ORDER — SODIUM CHLORIDE 0.9 % IV SOLN: 500.0000 mL | Freq: Once | INTRAVENOUS | Status: DC | PRN

## 2015-09-30 MED ORDER — LABETALOL HCL 5 MG/ML IV SOLN: 10.0000 mg | INTRAVENOUS | Status: DC | PRN

## 2015-09-30 MED ORDER — FENTANYL CITRATE (PF) 100 MCG/2ML IJ SOLN
INTRAMUSCULAR | Status: AC
  Filled 2015-09-30: qty 2

## 2015-09-30 MED ORDER — PHENOL 1.4 % MT LIQD: 1.0000 | OROMUCOSAL | Status: DC | PRN

## 2015-09-30 MED ORDER — OXYCODONE-ACETAMINOPHEN 5-325 MG PO TABS: 1.0000 | ORAL_TABLET | ORAL | Status: DC | PRN

## 2015-09-30 MED ORDER — IOPAMIDOL (ISOVUE-300) INJECTION 61%
INTRAVENOUS | Status: DC | PRN
  Administered 2015-09-30: 60 mL via INTRA_ARTERIAL

## 2015-09-30 MED ORDER — HYDROMORPHONE HCL 1 MG/ML IJ SOLN: 1.0000 mg | Freq: Once | INTRAMUSCULAR | Status: DC

## 2015-09-30 MED ORDER — SODIUM CHLORIDE 0.9 % IV SOLN
INTRAVENOUS | Status: DC
  Administered 2015-09-30: 11:00:00 via INTRAVENOUS

## 2015-09-30 MED ORDER — HEPARIN SODIUM (PORCINE) 1000 UNIT/ML IJ SOLN
INTRAMUSCULAR | Status: AC
  Filled 2015-09-30: qty 1

## 2015-09-30 MED ORDER — HYDRALAZINE HCL 20 MG/ML IJ SOLN: 5.0000 mg | INTRAMUSCULAR | Status: DC | PRN

## 2015-09-30 SURGICAL SUPPLY — 18 items
BALLN DORADO 7X60X80 (BALLOONS)
BALLN LUTONIX 6X150X130 (BALLOONS) ×4
BALLN ULTRVRSE 7X80X130C (BALLOONS) ×4
BALLOON DORADO 7X60X80 (BALLOONS) IMPLANT
BALLOON LUTONIX 6X150X130 (BALLOONS) ×2 IMPLANT
BALLOON ULTRVRSE 7X80X130C (BALLOONS) ×2 IMPLANT
CATH PIG 70CM (CATHETERS) ×4 IMPLANT
DEVICE PRESTO INFLATION (MISCELLANEOUS) ×4 IMPLANT
DEVICE STARCLOSE SE CLOSURE (Vascular Products) ×4 IMPLANT
LIFESTENT 7X120X130 (Permanent Stent) ×4 IMPLANT
PACK ANGIOGRAPHY (CUSTOM PROCEDURE TRAY) ×4 IMPLANT
SHEATH ANL 5FRX45 (SHEATH) ×8 IMPLANT
SHEATH BRITE TIP 5FRX11 (SHEATH) ×4 IMPLANT
SHEATH RAABE 6FR (SHEATH) ×4 IMPLANT
SYR MEDRAD MARK V 150ML (SYRINGE) ×4 IMPLANT
TUBING CONTRAST HIGH PRESS 72 (TUBING) ×4 IMPLANT
WIRE J 3MM .035X145CM (WIRE) ×4 IMPLANT
WIRE MAGIC TORQUE 260C (WIRE) ×4 IMPLANT

## 2015-09-30 NOTE — H&P (Signed)
  [facility] History & Physical Update  The patient was interviewed and re-examined.  The patient's previous History and Physical has been reviewed and is unchanged.  There is no change in the plan of care. We plan to proceed with the scheduled procedure.  DEW,JASON, MD  09/30/2015, 10:04 AM

## 2015-09-30 NOTE — Op Note (Signed)
[facility] Percutaneous Study/Intervention Procedural Note   Date of Surgery: 09/30/2015  Surgeon(s):DEW,JASON   Assistants:none  Pre-operative Diagnosis: PAD with claudication bilateral lower extremities  Post-operative diagnosis: Same  Procedure(s) Performed: 1. Ultrasound guidance for vascular access right femoral artery 2. Catheter placement into left superficial femoral artery from right femoral approach 3. Aortogram and selective left lower extremity angiogram 4. Percutaneous transluminal angioplasty of proximal superficial femoral artery with 6 mm diameter by 12 cm length Lutonix drug-coated angioplasty balloon 5. Percutaneous transluminal angioplasty of mid superficial femoral artery with 6 mm diameter angioplasty balloon  6.  Stent placement to the left proximal superficial femoral artery with 7 mm diameter by 12 cm length life stent for greater than 50% residual stenosis after angioplasty 7. StarClose closure device right femoral artery  EBL: 20 cc  Contrast: 60 cc  Fluoro Time: 4.7 minutes  Moderate Conscious Sedation Time: approximately 40 minutes using 4 mg of Versed and 100 mcg of Fentanyl  Indications: Patient is a 55 y.o.male with recurrent claudication symptoms in both lower extremities. The patient has noninvasive study showing multiple areas of elevated velocities throughout both superficial femoral arteries as well as some possible stenosis in the iliac arteries. The patient is brought in for angiography for further evaluation and potential treatment. Risks and benefits are discussed and informed consent is obtained  Procedure: The patient was identified and appropriate procedural time out was performed. The patient was then placed supine on the table and prepped and draped in the usual sterile fashion.Moderate conscious sedation was  administered during a face to face encounter with the patient throughout the procedure with my supervision of the RN administering medicines and monitoring the patient's vital signs, pulse oximetry, telemetry and mental status throughout from the start of the procedure until the patient was taken to the recovery room. Ultrasound was used to evaluate the right common femoral artery. It was patent . A digital ultrasound image was acquired. A Seldinger needle was used to access the right common femoral artery under direct ultrasound guidance and a permanent image was performed. A 0.035 J wire was advanced without resistance and a 5Fr sheath was placed. Pigtail catheter was placed into the aorta and an AP aortogram was performed. This demonstrated normal renal arteries and normal aorta and iliac segments without significant stenosis although some mild stenosis of less than 30% were identified in both external iliac arteries on oblique arteriograms of the pelvis. I then crossed the aortic bifurcation and advanced to the left femoral head and into the proximal left superficial femoral artery were stent had already been placed. Selective left lower extremity angiogram was then performed. This demonstrated High-grade stenosis between previously placed stents in the proximal to mid SFA of greater than 80%. Moderate stenosis in the mid SFA below his second previously placed stent at about 60-65%. Two-vessel runoff distally. The 2 previous stents themselves appeared patent with less than 20% stenosis.. The patient was systemically heparinized and a 6 French sheath was then placed over the Magic torque wire. I then used a Magic torque wire to navigate through the SFA stenoses. A 6 mm diameter by 12 cm length Lutonix drug-coated angioplasty balloon was then inflated in the proximal superficial femoral artery between the previously placed stents. This was inflated to 12 atm for 1 minute. The balloon was then advanced down to  the mid superficial femoral artery to treat the moderate stenosis below the mid SFA stent. It was again inflated to 12 atm  for 1 minute. The proximal stenosis remained greater than 70% but the mid SFA stenosis was now only about 15-20% I attempted to treat the proximal lesion with a 7 mm balloon before placing a stent, but the balloon ruptured at only 4 atm. At this point, I felt that stent placement was appropriate. A 7 mm diameter by 12 cm length life stent was then deployed in the proximal superficial femoral artery bridging between the 2 previously placed stents with minimal overlap proximally and distally. This was postdilated with a 7 mm balloon to 10 atm. Completion angiogram showed less than 10% residual stenosis after stent placement with brisk flow distally. I elected to terminate the procedure. The sheath was removed and StarClose closure device was deployed in the right femoral artery with excellent hemostatic result. The patient was taken to the recovery room in stable condition having tolerated the procedure well.  Findings:  Aortogram: Normal renal arteries. Aorta normal without significant stenosis. Bilateral external iliac arteries with minimal stenosis of less than 30%. No hemodynamically stenosis identified in the aorta or iliac segments Left Lower Extremity: High-grade stenosis between previously placed stents in the proximal to mid SFA of greater than 80%. Moderate stenosis in the mid SFA below her previously placed stent at about 60-65%. Two-vessel runoff distally. The previous stents themselves appeared patent with less than 20% stenosis.   Disposition: Patient was taken to the recovery room in stable condition having tolerated the procedure well.  Complications: None  DEW,JASON 09/30/2015 12:11 PM

## 2015-10-02 ENCOUNTER — Ambulatory Visit (INDEPENDENT_AMBULATORY_CARE_PROVIDER_SITE_OTHER): Payer: BLUE CROSS/BLUE SHIELD | Admitting: Urology

## 2015-10-02 ENCOUNTER — Telehealth: Payer: Self-pay | Admitting: Urology

## 2015-10-02 ENCOUNTER — Encounter: Payer: Self-pay | Admitting: Urology

## 2015-10-02 VITALS — BP 97/59 | HR 91 | Ht 68.0 in | Wt 210.2 lb

## 2015-10-02 DIAGNOSIS — N529 Male erectile dysfunction, unspecified: Secondary | ICD-10-CM

## 2015-10-02 DIAGNOSIS — N528 Other male erectile dysfunction: Secondary | ICD-10-CM

## 2015-10-02 DIAGNOSIS — E291 Testicular hypofunction: Secondary | ICD-10-CM

## 2015-10-02 DIAGNOSIS — N401 Enlarged prostate with lower urinary tract symptoms: Secondary | ICD-10-CM | POA: Diagnosis not present

## 2015-10-02 DIAGNOSIS — N138 Other obstructive and reflux uropathy: Secondary | ICD-10-CM

## 2015-10-02 NOTE — Telephone Encounter (Signed)
Would you call in [facility] for this patient to [facility]?   

## 2015-10-02 NOTE — Progress Notes (Signed)
10/02/2015 3:00 PM   HAN VEJAR 01-28-1961 [id number]  Referring provider: No referring provider defined for this encounter.  Chief Complaint  Patient presents with  . Low Testosterone    referred by Gean Birchwood    HPI: Patient is a 55 year old Caucasian male who is referred to [place] by Verlee Monte, PA-C for low testosterone.  Patient is experiencing a decrease a lack of energy, erections being less strong and falling asleep after dinner.  This is indicated by his responses to the ADAM questionnaire.  His most recent morning testosterone level was 219 ng/dL on 40/98/1191.          Androgen Deficiency in the Aging Male      10/02/15 1400       Androgen Deficiency in the Aging Male   Do you have a decrease in libido (sex drive) No     Do you have lack of energy Yes     Do you have a decrease in strength and/or endurance No     Have you lost height No     Have you noticed a decreased "enjoyment of life" No     Are you sad and/or grumpy No     Are your erections less strong Yes     Have you noticed a recent deterioration in your ability to play sports No     Are you falling asleep after dinner Yes     Has there been a recent deterioration in your work performance No       His SHIM score is 5, which is severe erectile dysfunction.   He has been having difficulty with erections for several years.   His major complaint is he cannot achieve an erection.  His libido is preserved.   His risk factors for ED are smoking, hypertension, peripheral vascular disease, coronary artery disease, COPD, chronic kidney disease and age.  He denies any painful erections or curvatures with his erections.   He has tried PDE5-inhibitors in the past and they were not effective.  He was pain $80 a month for a supplement advertised on television for his erections.  It was not effective.      SHIM      10/02/15 1445       SHIM: Over the last 6 months:   How do you rate your confidence that  you could get and keep an erection? Very Low     When you had erections with sexual stimulation, how often were your erections hard enough for penetration (entering your partner)? Almost Never or Never     During sexual intercourse, how often were you able to maintain your erection after you had penetrated (entered) your partner? Extremely Difficult     During sexual intercourse, how difficult was it to maintain your erection to completion of intercourse? Extremely Difficult     When you attempted sexual intercourse, how often was it satisfactory for you? Extremely Difficult     SHIM Total Score   SHIM 5        Score: 1-7 Severe ED 8-11 Moderate ED 12-16 Mild-Moderate ED 17-21 Mild ED 22-25 No ED   His IPSS score today is 7, which is mild lower urinary tract symptomatology. He is pleased with his quality life due to his urinary symptoms. He denies any dysuria, hematuria or suprapubic pain.  He also denies any recent fevers, chills, nausea or vomiting.  He does not have a family history of PCa.  IPSS      10/02/15 1400       International Prostate Symptom Score   How often have you had the sensation of not emptying your bladder? Not at All     How often have you had to urinate less than every two hours? About half the time     How often have you found you stopped and started again several times when you urinated? Not at All     How often have you found it difficult to postpone urination? Not at All     How often have you had a weak urinary stream? Not at All     How often have you had to strain to start urination? Less than 1 in 5 times     How many times did you typically get up at night to urinate? 3 Times     Total IPSS Score 7     Quality of Life due to urinary symptoms   If you were to spend the rest of your life with your urinary condition just the way it is now how would you feel about that? Pleased        Score:  1-7 Mild 8-19 Moderate 20-35  Severe       PMH: Past Medical History  Diagnosis Date  . COPD (chronic obstructive pulmonary disease) (HCC)   . Alcoholic (HCC)   . Hypertension   . Peripheral vascular disease (HCC)   . Lymphedema   . Coronary artery disease   . Chronic kidney disease   . Varicose veins   . Heartburn   . DVT (deep venous thrombosis) [facility])     Surgical History: Past Surgical History  Procedure Laterality Date  . Hernia repair    . Lower extremity angiogram Bilateral   . Peripheral vascular catheterization Left 09/30/2015    Procedure: Lower Extremity Angiography;  Surgeon: Annice Needy, MD;  Location: [facility] INVASIVE CV LAB;  Service: Cardiovascular;  Laterality: Left;  . Peripheral vascular catheterization  09/30/2015    Procedure: Lower Extremity Intervention;  Surgeon: Annice Needy, MD;  Location: [facility] INVASIVE CV LAB;  Service: Cardiovascular;;    Home Medications:    Medication List       This list is accurate as of: 10/02/15  3:00 PM.  Always use your most recent med list.               albuterol 108 (90 Base) MCG/ACT inhaler  Commonly known as:  PROVENTIL HFA;VENTOLIN HFA  Inhale 2 puffs into the lungs every 6 (six) hours as needed for wheezing or shortness of breath.     amLODipine 2.5 MG tablet  Commonly known as:  NORVASC  Take 2.5 mg by mouth daily.     aspirin 81 MG tablet  Take 81 mg by mouth daily.     buPROPion 150 MG 12 hr tablet  Commonly known as:  ZYBAN  Take 150 mg by mouth 2 (two) times daily.     cetirizine 10 MG tablet  Commonly known as:  ZYRTEC  Take 10 mg by mouth daily.     clopidogrel 75 MG tablet  Commonly known as:  PLAVIX  Take 1 tablet (75 mg total) by mouth daily.     fluticasone 50 MCG/ACT nasal spray  Commonly known as:  FLONASE  Place 2 sprays into both nostrils daily as needed for allergies or rhinitis.     hydrALAZINE 10 MG tablet  Commonly known as:  APRESOLINE  Take 40 mg by mouth daily.     hydrochlorothiazide 25 MG tablet   Commonly known as:  HYDRODIURIL  Take 25 mg by mouth daily.     ipratropium 17 MCG/ACT inhaler  Commonly known as:  ATROVENT HFA  Inhale 2 puffs into the lungs 3 (three) times daily.     ipratropium-albuterol 0.5-2.5 (3) MG/3ML Soln  Commonly known as:  DUONEB  Inhale into the lungs.     lisinopril 40 MG tablet  Commonly known as:  PRINIVIL,ZESTRIL     omeprazole 20 MG capsule  Commonly known as:  PRILOSEC  Take 20 mg by mouth daily. Reported on 10/02/2015     predniSONE 10 MG tablet  Commonly known as:  DELTASONE     tiotropium 18 MCG inhalation capsule  Commonly known as:  SPIRIVA  Place into inhaler and inhale.     VIAGRA 50 MG tablet  Generic drug:  sildenafil  Take 50 mg by mouth as needed for erectile dysfunction. Reported on 10/02/2015        Allergies: No Known Allergies  Family History: Family History  Problem Relation Age of Onset  . Kidney disease Neg Hx   . Prostate cancer Neg Hx     Social History:  reports that he has been smoking.  He does not have any smokeless tobacco history on file. He reports that he does not drink alcohol or use illicit drugs.  ROS: UROLOGY Frequent Urination?: No Hard to postpone urination?: No Burning/pain with urination?: No Get up at night to urinate?: Yes Leakage of urine?: Yes Urine stream starts and stops?: No Trouble starting stream?: No Do you have to strain to urinate?: No Blood in urine?: No Urinary tract infection?: No Sexually transmitted disease?: No Injury to kidneys or bladder?: No Painful intercourse?: No Weak stream?: No Erection problems?: No Penile pain?: No  Gastrointestinal Nausea?: No Vomiting?: No Indigestion/heartburn?: No Diarrhea?: No Constipation?: No  Constitutional Fever: No Night sweats?: No Weight loss?: No Fatigue?: No  Skin Skin rash/lesions?: No Itching?: No  Eyes Blurred vision?: Yes Double vision?: No  Ears/Nose/Throat Sore throat?: No Sinus problems?:  Yes  Hematologic/Lymphatic Swollen glands?: No Easy bruising?: No  Cardiovascular Leg swelling?: No Chest pain?: No  Respiratory Cough?: Yes Shortness of breath?: Yes  Endocrine Excessive thirst?: No  Musculoskeletal Back pain?: No Joint pain?: No  Neurological Headaches?: No Dizziness?: No  Psychologic Depression?: No Anxiety?: No  Physical Exam: BP 97/59 mmHg  Pulse 91  Ht 5\' 8"  (1.727 m)  Wt 210 lb 3.2 oz (95.346 kg)  BMI 31.97 kg/m2  Constitutional: Well nourished. Alert and oriented, No acute distress. HEENT: [place] AT, moist mucus membranes. Trachea midline, no masses. Cardiovascular: No clubbing, cyanosis, or edema. Respiratory: Normal respiratory effort, no increased work of breathing. GI: Abdomen is soft, non tender, non distended, no abdominal masses. Liver and spleen not palpable.  No hernias appreciated.  Stool sample for occult testing is not indicated.   GU: No CVA tenderness.  No bladder fullness or masses.  Patient with circumcised phallus.   Urethral meatus is patent.  No penile discharge. No penile lesions or rashes. Scrotum without lesions, cysts, rashes and/or edema.  Testicles are located scrotally bilaterally. No masses are appreciated in the testicles. Left and right epididymis are normal. Rectal: Patient with  normal sphincter tone. Anus and perineum without scarring or rashes. No rectal masses are appreciated. Prostate is approximately 45 grams, no nodules are appreciated. Seminal vesicles are normal. Skin: No rashes,  bruises or suspicious lesions. Lymph: No cervical or inguinal adenopathy. Neurologic: Grossly intact, no focal deficits, moving all 4 extremities. Psychiatric: Normal mood and affect.  Laboratory Data: Lab Results  Component Value Date   WBC 14.1* 03/03/2014   HGB 14.2 03/03/2014   HCT 44.5 03/03/2014   MCV 91 03/03/2014   PLT 262 03/03/2014    Lab Results  Component Value Date   CREATININE 1.32* 09/30/2015    Lab  Results  Component Value Date   HGBA1C 6.2 03/05/2014    Assessment & Plan:    1. Hypogonadism:   I explained to patient that the current recommendations from the Endocrine Society reports the diagnosis of hypogonadism requires a serum total testosterone level obtained between 8 and 10 AM at least 2 days apart that is below the laboratory parameters  for normal testosterone.  Our office obtains testosterone levels before 9 AM as we have found some insurance for these requests this time.    At this time, the patient does not meet this requirement.   I discussed with the patient the side effects of testosterone therapy, such as: enlargement of the prostate gland that may in turn cause LUTS, possible increased risk of PCa, DVT's and/or PE's, possible increased risk of heart attack or stroke, lower sperm count, swelling of the ankles, feet, or body, with or without heart failure, enlarged or painful breasts, have problems breathing while you sleep (sleep apnea), increased prostate specific antigen, mood swings, hypertension and increased red blood cell count.  I explained to the patient that since he had exhibited some of the risk factors without testosterone therapy, such as DVT, lower extremity edema, CAD, etc., if he was persistent on pursuing testosterone therapy he would need medical/cardiac clearance before initiating any of these medications.  He stated his main complaint was his erectile dysfunction and I explained to him that testosterone was not a treatment for this condition.  I also discussed that some men have had success using clomid for hypogonadism.  It does seem to be more successful in younger men, but there are incidences of good results in middle-aged men.  I explained that it is used in male infertility to stimulate the testicles to make more testosterone/sperm.  There has been no long term data on side effects, but some urologists has been having success with this medication.      2. Erectile dysfunction:   I explained to the patient that in order to achieve an erection it takes good functioning of the nervous system (parasympathetic, sympathetic, sensory and motor), good blood flow into the erectile tissue of the penis and a desire to have sex.   I stated that conditions like diabetes, hypertension, coronary artery disease, peripheral vascular disease, smoking, alcohol consumption, age and BPH can diminish the ability to have an erection.   We discussed trying a different PDE5 inhibitor, intra-urethral suppositories, intracavernous vasoactive drug injection therapy, vacuum constriction device and penile prosthesis implantation.  He states he has tried all of the PDE 5 inhibitors available without success.  Due to his limited funds, we decided that intracavernosal vasoactive drug injection therapy would be his most logical choice to achieve an erection.  [facility] and have a try mix prescription sent.  Patient will contact the office when he has acquired the medication to schedule a test dose injection.  3. BPH (benign prostatic hyperplasia) with LUTS:   IPSS score is 7/1.  We will continue to monitor.  He  should have his IPSS score, exam and PSA in 12 months.  - PSA   Return for patient to call to schedule Trimix injections .  These notes generated with voice recognition software. I apologize for typographical errors.  Michiel CowboySHANNON Kohan Azizi, PA-C  [facility] [address], [address] [place], [place] [address] [phone]

## 2015-10-03 ENCOUNTER — Telehealth: Payer: Self-pay

## 2015-10-03 LAB — PSA: Prostate Specific Ag, Serum: 0.5 ng/mL (ref 0.0–4.0)

## 2015-10-03 NOTE — Telephone Encounter (Signed)
Medication called into [facility].  

## 2015-10-03 NOTE — Telephone Encounter (Signed)
-----   Message from Shannon A McGowan, PA-C sent at 10/03/2015  8:06 AM EDT ----- PSA is normal. 

## 2015-10-03 NOTE — H&P (Signed)
[facility] Admission History & Physical  MRN : [id number]  Vincent HedgeGeorge D Newingham is a 55 y.o. (Sep 17, 1960) male who presents with chief complaint of No chief complaint on file. Marland Kitchen.  History of Present Illness: Patient presents with worsening symptoms of claudication particularly in the left lower extremity. Previous revascularization for short distance claudication in the past. His symptoms have worsened over a matter of weeks to months. He was seen as an outpatient. Discussed options and desired to have a repeat angiogram of the left lower extremity in hopes of invasive therapy improving his claudication symptoms on the left. He denies fevers or chills or signs systemic infection. He denies chest pain. He has chronic shortness of breath from his COPD  No current facility-administered medications for this encounter.   Current Outpatient Prescriptions  Medication Sig Dispense Refill  . albuterol (PROVENTIL HFA;VENTOLIN HFA) 108 (90 Base) MCG/ACT inhaler Inhale 2 puffs into the lungs every 6 (six) hours as needed for wheezing or shortness of breath.    Marland Kitchen. amLODipine (NORVASC) 2.5 MG tablet Take 2.5 mg by mouth daily.    Marland Kitchen. aspirin 81 MG tablet Take 81 mg by mouth daily.    Marland Kitchen. buPROPion (ZYBAN) 150 MG 12 hr tablet Take 150 mg by mouth 2 (two) times daily.    . cetirizine (ZYRTEC) 10 MG tablet Take 10 mg by mouth daily.    . fluticasone (FLONASE) 50 MCG/ACT nasal spray Place 2 sprays into both nostrils daily as needed for allergies or rhinitis.    . hydrALAZINE (APRESOLINE) 10 MG tablet Take 40 mg by mouth daily.    . hydrochlorothiazide (HYDRODIURIL) 25 MG tablet Take 25 mg by mouth daily.    Marland Kitchen. ipratropium (ATROVENT HFA) 17 MCG/ACT inhaler Inhale 2 puffs into the lungs 3 (three) times daily.    Marland Kitchen. omeprazole (PRILOSEC) 20 MG capsule Take 20 mg by mouth daily. Reported on 10/02/2015    . sildenafil (VIAGRA) 50 MG tablet Take 50 mg by mouth as needed for erectile dysfunction. Reported  on 10/02/2015    . clopidogrel (PLAVIX) 75 MG tablet Take 1 tablet (75 mg total) by mouth daily. 30 tablet 11  . ipratropium-albuterol (DUONEB) 0.5-2.5 (3) MG/3ML SOLN Inhale into the lungs.    Marland Kitchen. lisinopril (PRINIVIL,ZESTRIL) 40 MG tablet   4  . predniSONE (DELTASONE) 10 MG tablet   0  . tiotropium (SPIRIVA) 18 MCG inhalation capsule Place into inhaler and inhale.      Past Medical History  Diagnosis Date  . COPD (chronic obstructive pulmonary disease) (HCC)   . Alcoholic (HCC)   . Hypertension   . Peripheral vascular disease (HCC)   . Lymphedema   . Coronary artery disease   . Chronic kidney disease   . Varicose veins   . Heartburn   . DVT (deep venous thrombosis) [facility](HCC)     Past Surgical History  Procedure Laterality Date  . Hernia repair    . Lower extremity angiogram Bilateral   . Peripheral vascular catheterization Left 09/30/2015    Procedure: Lower Extremity Angiography;  Surgeon: Annice NeedyJason S Dew, MD;  Location: [facility] INVASIVE CV LAB;  Service: Cardiovascular;  Laterality: Left;  . Peripheral vascular catheterization  09/30/2015    Procedure: Lower Extremity Intervention;  Surgeon: Annice NeedyJason S Dew, MD;  Location: [facility] INVASIVE CV LAB;  Service: Cardiovascular;;    Social History Social History  Substance Use Topics  . Smoking status: Current Every Day Smoker -- 1.00 packs/day for 49 years  .  Smokeless tobacco: None  . Alcohol Use: No  No IVDU  Family History Family History  Problem Relation Age of Onset  . Kidney disease Neg Hx   . Prostate cancer Neg Hx   no bleeding disorders, clotting disorders, or autoimmune diseases  No Known Allergies   REVIEW OF SYSTEMS (Negative unless checked)  Constitutional: [] Weight loss  [] Fever  [] Chills Cardiac: [] Chest pain   [] Chest pressure   [] Palpitations   [] Shortness of breath when laying flat   [] Shortness of breath at rest   [x] Shortness of breath with exertion. Vascular:  [] Pain in legs with walking   [] Pain in legs at rest    [] Pain in legs when laying flat   [x] Claudication   [x] Pain in feet when walking  [] Pain in feet at rest  [] Pain in feet when laying flat   [] History of DVT   [] Phlebitis   [] Swelling in legs   [] Varicose veins   [] Non-healing ulcers Pulmonary:   [] Uses home oxygen   [] Productive cough   [] Hemoptysis   [] Wheeze  [x] COPD   [] Asthma Neurologic:  [] Dizziness  [] Blackouts   [] Seizures   [] History of stroke   [] History of TIA  [] Aphasia   [] Temporary blindness   [] Dysphagia   [] Weakness or numbness in arms   [] Weakness or numbness in legs Musculoskeletal:  [] Arthritis   [] Joint swelling   [] Joint pain   [] Low back pain Hematologic:  [] Easy bruising  [] Easy bleeding   [] Hypercoagulable state   [] Anemic  [] Hepatitis Gastrointestinal:  [] Blood in stool   [] Vomiting blood  [] Gastroesophageal reflux/heartburn   [] Difficulty swallowing. Genitourinary:  [] Chronic kidney disease   [] Difficult urination  [] Frequent urination  [] Burning with urination   [] Blood in urine Skin:  [] Rashes   [] Ulcers   [] Wounds Psychological:  [] History of anxiety   []  History of major depression.  Physical Examination  Filed Vitals:   09/30/15 1315 09/30/15 1330 09/30/15 1345 09/30/15 1400  BP: 124/68 126/64 126/64 128/74  Pulse: 57 58 65 71  Resp: 17 19 18 18   Height:      Weight:      SpO2: 95% 91% 95% 96%   Body mass index is 31.63 kg/(m^2). Gen: WD/WN, NAD Head: [place]/AT, No temporalis wasting. Prominent temp pulse not noted. Ear/Nose/Throat: Hearing grossly intact, nares w/o erythema or drainage, oropharynx w/o Erythema/Exudate,  Eyes: PERRLA, EOMI.  Neck: Supple, no nuchal rigidity.  No bruit or JVD.  Pulmonary:  Good air movement, Expiratory wheezes Cardiac: RRR, normal S1, S2, no Murmurs, rubs or gallops. Vascular:  Vessel Right Left  Radial Palpable Palpable  Ulnar Palpable Palpable  Brachial Palpable Palpable  Carotid Palpable, without bruit Palpable, without bruit  Aorta Not palpable N/A  Femoral Palpable  Palpable  Popliteal Palpable Not Palpable  PT Palpable Palpable  DP Not Palpable Not Palpable   Gastrointestinal: soft, non-tender/non-distended. No guarding/reflex.  Musculoskeletal: M/S 5/5 throughout.  Extremities without ischemic changes.  No deformity or atrophy.  Neurologic: CN 2-12 intact. Pain and light touch intact in extremities.  Symmetrical.  Speech is fluent. Motor exam as listed above. Psychiatric: Judgment intact, Mood & affect appropriate for pt's clinical situation. Dermatologic: No rashes or ulcers noted.  No cellulitis or open wounds. Lymph : No Cervical, Axillary, or Inguinal lymphadenopathy.     CBC Lab Results  Component Value Date   WBC 14.1* 03/03/2014   HGB 14.2 03/03/2014   HCT 44.5 03/03/2014   MCV 91 03/03/2014   PLT 262 03/03/2014    BMET  Component Value Date/Time   NA 138 03/03/2014 0605   K 4.1 03/03/2014 0605   CL 103 03/03/2014 0605   CO2 28 03/03/2014 0605   GLUCOSE 141* 03/03/2014 0605   BUN 23* 09/30/2015 1023   BUN 24* 09/04/2014 0710   CREATININE 1.32* 09/30/2015 1023   CREATININE 1.36* 09/04/2014 0710   CALCIUM 8.0* 03/03/2014 0605   GFRNONAA 59* 09/30/2015 1023   GFRNONAA 58* 09/04/2014 0710   GFRNONAA >60 03/03/2014 0605   GFRAA >60 09/30/2015 1023   GFRAA >60 09/04/2014 0710   GFRAA >60 03/03/2014 0605   Estimated Creatinine Clearance: 69.6 mL/min (by C-G formula based on Cr of 1.32).  COAG No results found for: INR, PROTIME  Radiology No results found.   Assessment/Plan 1. PAD with claudication symptoms worsen the left lower extremity. Risks and benefits of angiogram discussed and he is desirable to proceed. 2. COPD. Stable. 3. Hypertension. Stable on outpatient medications.   DEW,JASON, MD  10/03/2015 8:48 AM

## 2015-10-03 NOTE — Telephone Encounter (Signed)
LMOM

## 2015-10-04 NOTE — Telephone Encounter (Signed)
Pt notified of normal PSA level.

## 2015-10-05 ENCOUNTER — Telehealth: Payer: Self-pay | Admitting: Urology

## 2015-10-05 DIAGNOSIS — E291 Testicular hypofunction: Secondary | ICD-10-CM | POA: Insufficient documentation

## 2015-10-05 DIAGNOSIS — N529 Male erectile dysfunction, unspecified: Secondary | ICD-10-CM | POA: Insufficient documentation

## 2015-10-05 DIAGNOSIS — N138 Other obstructive and reflux uropathy: Secondary | ICD-10-CM | POA: Insufficient documentation

## 2015-10-05 DIAGNOSIS — N401 Enlarged prostate with lower urinary tract symptoms: Secondary | ICD-10-CM

## 2015-10-05 NOTE — Telephone Encounter (Signed)
Please, send a copy of this office visit to the patient's referring provider, Geraldine SolarKelsy Walsch, PA-C.

## 2015-10-07 ENCOUNTER — Telehealth: Payer: Self-pay

## 2015-10-07 NOTE — Telephone Encounter (Signed)
done

## 2015-10-07 NOTE — Telephone Encounter (Signed)
-----   Message from Harle BattiestShannon A McGowan, PA-C sent at 10/03/2015  8:06 AM EDT ----- PSA is normal.

## 2016-02-25 ENCOUNTER — Other Ambulatory Visit (INDEPENDENT_AMBULATORY_CARE_PROVIDER_SITE_OTHER): Payer: Self-pay | Admitting: Vascular Surgery

## 2016-03-02 ENCOUNTER — Other Ambulatory Visit
Admission: RE | Admit: 2016-03-02 | Discharge: 2016-03-02 | Disposition: A | Discharge status: Home / Self Care | Payer: BLUE CROSS/BLUE SHIELD | Source: Ambulatory Visit | Attending: Vascular Surgery | Admitting: Vascular Surgery

## 2016-03-02 DIAGNOSIS — I739 Peripheral vascular disease, unspecified: Secondary | ICD-10-CM | POA: Insufficient documentation

## 2016-03-02 LAB — CREATININE, SERUM
Creatinine, Ser: 1.73 mg/dL — ABNORMAL HIGH (ref 0.61–1.24)
GFR calc non Af Amer: 42 mL/min — ABNORMAL LOW (ref >60)
GFR, EST AFRICAN AMERICAN: 49 mL/min — AB (ref >60)

## 2016-03-02 LAB — BUN: BUN: 20 mg/dL (ref 6–20)

## 2016-03-03 ENCOUNTER — Encounter
Admission: RE | Disposition: A | Discharge status: Home / Self Care | Payer: Self-pay | Source: Ambulatory Visit | Attending: Vascular Surgery

## 2016-03-03 ENCOUNTER — Ambulatory Visit
Admission: RE | Admit: 2016-03-03 | Discharge: 2016-03-03 | Disposition: A | Discharge status: Home / Self Care | Payer: BLUE CROSS/BLUE SHIELD | Source: Ambulatory Visit | Attending: Vascular Surgery | Admitting: Vascular Surgery

## 2016-03-03 DIAGNOSIS — I70211 Atherosclerosis of native arteries of extremities with intermittent claudication, right leg: Secondary | ICD-10-CM | POA: Diagnosis present

## 2016-03-03 DIAGNOSIS — N189 Chronic kidney disease, unspecified: Secondary | ICD-10-CM | POA: Insufficient documentation

## 2016-03-03 DIAGNOSIS — I872 Venous insufficiency (chronic) (peripheral): Secondary | ICD-10-CM | POA: Diagnosis not present

## 2016-03-03 DIAGNOSIS — E669 Obesity, unspecified: Secondary | ICD-10-CM | POA: Diagnosis not present

## 2016-03-03 DIAGNOSIS — Z7982 Long term (current) use of aspirin: Secondary | ICD-10-CM | POA: Diagnosis not present

## 2016-03-03 DIAGNOSIS — F17200 Nicotine dependence, unspecified, uncomplicated: Secondary | ICD-10-CM | POA: Insufficient documentation

## 2016-03-03 DIAGNOSIS — Z8249 Family history of ischemic heart disease and other diseases of the circulatory system: Secondary | ICD-10-CM | POA: Insufficient documentation

## 2016-03-03 DIAGNOSIS — I129 Hypertensive chronic kidney disease with stage 1 through stage 4 chronic kidney disease, or unspecified chronic kidney disease: Secondary | ICD-10-CM | POA: Diagnosis not present

## 2016-03-03 DIAGNOSIS — Z801 Family history of malignant neoplasm of trachea, bronchus and lung: Secondary | ICD-10-CM | POA: Diagnosis not present

## 2016-03-03 DIAGNOSIS — J449 Chronic obstructive pulmonary disease, unspecified: Secondary | ICD-10-CM | POA: Diagnosis not present

## 2016-03-03 DIAGNOSIS — I868 Varicose veins of other specified sites: Secondary | ICD-10-CM | POA: Insufficient documentation

## 2016-03-03 DIAGNOSIS — I890 Lymphedema, not elsewhere classified: Secondary | ICD-10-CM | POA: Insufficient documentation

## 2016-03-03 HISTORY — PX: PERIPHERAL VASCULAR CATHETERIZATION: SHX172C

## 2016-03-03 SURGERY — LOWER EXTREMITY ANGIOGRAPHY
Anesthesia: Moderate Sedation

## 2016-03-03 MED ORDER — DEXTROSE 5 % IV SOLN
1.5000 g | INTRAVENOUS | Status: AC
  Administered 2016-03-03: 1.5 g via INTRAVENOUS

## 2016-03-03 MED ORDER — IPRATROPIUM-ALBUTEROL 0.5-2.5 (3) MG/3ML IN SOLN
RESPIRATORY_TRACT | Status: AC
  Filled 2016-03-03: qty 3

## 2016-03-03 MED ORDER — IOPAMIDOL (ISOVUE-300) INJECTION 61%
INTRAVENOUS | Status: DC | PRN
  Administered 2016-03-03: 70 mL via INTRA_ARTERIAL

## 2016-03-03 MED ORDER — SODIUM CHLORIDE 0.9 % IV SOLN
Freq: Once | INTRAVENOUS | Status: AC
  Administered 2016-03-03: 500 mL via INTRAVENOUS

## 2016-03-03 MED ORDER — FENTANYL CITRATE (PF) 100 MCG/2ML IJ SOLN
INTRAMUSCULAR | Status: AC
Start: 2016-03-03 — End: 2016-03-03
  Filled 2016-03-03: qty 2

## 2016-03-03 MED ORDER — IPRATROPIUM-ALBUTEROL 0.5-2.5 (3) MG/3ML IN SOLN
3.0000 mL | Freq: Once | RESPIRATORY_TRACT | Status: AC
  Administered 2016-03-03: 3 mL via RESPIRATORY_TRACT

## 2016-03-03 MED ORDER — MIDAZOLAM HCL 2 MG/2ML IJ SOLN
INTRAMUSCULAR | Status: DC | PRN
  Administered 2016-03-03: 1 mg via INTRAVENOUS

## 2016-03-03 MED ORDER — METHYLPREDNISOLONE SODIUM SUCC 125 MG IJ SOLR: 125.0000 mg | INTRAMUSCULAR | Status: DC | PRN

## 2016-03-03 MED ORDER — MIDAZOLAM HCL 5 MG/5ML IJ SOLN
INTRAMUSCULAR | Status: AC
  Filled 2016-03-03: qty 5

## 2016-03-03 MED ORDER — HEPARIN (PORCINE) IN NACL 2-0.9 UNIT/ML-% IJ SOLN
INTRAMUSCULAR | Status: AC
  Filled 2016-03-03: qty 1000

## 2016-03-03 MED ORDER — LIDOCAINE HCL (PF) 1 % IJ SOLN
INTRAMUSCULAR | Status: AC
  Filled 2016-03-03: qty 30

## 2016-03-03 MED ORDER — HYDROMORPHONE HCL 1 MG/ML IJ SOLN: 1.0000 mg | Freq: Once | INTRAMUSCULAR | Status: DC

## 2016-03-03 MED ORDER — FAMOTIDINE 20 MG PO TABS: 40.0000 mg | ORAL_TABLET | ORAL | Status: DC | PRN

## 2016-03-03 MED ORDER — ONDANSETRON HCL 4 MG/2ML IJ SOLN: 4.0000 mg | Freq: Four times a day (QID) | INTRAMUSCULAR | Status: DC | PRN

## 2016-03-03 MED ORDER — MIDAZOLAM HCL 5 MG/ML IJ SOLN
INTRAMUSCULAR | Status: DC | PRN
  Administered 2016-03-03: 1 mg via INTRAVENOUS
  Administered 2016-03-03: 2 mg via INTRAVENOUS
  Administered 2016-03-03: 1 mg via INTRAVENOUS

## 2016-03-03 MED ORDER — SODIUM CHLORIDE 0.9 % IV SOLN
INTRAVENOUS | Status: DC
  Administered 2016-03-03: 1000 mL via INTRAVENOUS

## 2016-03-03 MED ORDER — HEPARIN SODIUM (PORCINE) 1000 UNIT/ML IJ SOLN
INTRAMUSCULAR | Status: AC
  Filled 2016-03-03: qty 1

## 2016-03-03 MED ORDER — HEPARIN SODIUM (PORCINE) 1000 UNIT/ML IJ SOLN
INTRAMUSCULAR | Status: DC | PRN
  Administered 2016-03-03: 4000 [IU] via INTRAVENOUS

## 2016-03-03 MED ORDER — FENTANYL CITRATE (PF) 100 MCG/2ML IJ SOLN
INTRAMUSCULAR | Status: AC
  Filled 2016-03-03: qty 2

## 2016-03-03 MED ORDER — FENTANYL CITRATE (PF) 100 MCG/2ML IJ SOLN
INTRAMUSCULAR | Status: DC | PRN
  Administered 2016-03-03: 50 ug via INTRAVENOUS
  Administered 2016-03-03 (×2): 25 ug via INTRAVENOUS
  Administered 2016-03-03: 50 ug via INTRAVENOUS
  Administered 2016-03-03: 25 ug via INTRAVENOUS

## 2016-03-03 SURGICAL SUPPLY — 19 items
BALLN LUTONIX DCB 5X40X130 (BALLOONS) ×4
BALLOON LUTONIX DCB 5X40X130 (BALLOONS) ×2 IMPLANT
CANNULA 5F STIFF (CANNULA) ×4 IMPLANT
CATH BERNSTEIN 5FR 130CM (CATHETERS) ×4 IMPLANT
CATH PIG 70CM (CATHETERS) ×4 IMPLANT
DEVICE CLOSURE MYNXGRIP 6/7F (Vascular Products) ×4 IMPLANT
DEVICE PRESTO INFLATION (MISCELLANEOUS) ×4 IMPLANT
DEVICE TORQUE (MISCELLANEOUS) ×4 IMPLANT
GLIDEWIRE ANGLED SS 035X260CM (WIRE) ×4 IMPLANT
GUIDEWIRE SUPER STIFF .035X180 (WIRE) ×4 IMPLANT
PACK ANGIOGRAPHY (CUSTOM PROCEDURE TRAY) ×4 IMPLANT
SET INTRO CAPELLA COAXIAL (SET/KITS/TRAYS/PACK) ×4 IMPLANT
SHEATH BRITE TIP 5FRX11 (SHEATH) ×8 IMPLANT
SHEATH BRITE TIP 6FRX11 (SHEATH) ×4 IMPLANT
SHEATH RAABE 6FR (SHEATH) ×4 IMPLANT
SYR MEDRAD MARK V 150ML (SYRINGE) ×4 IMPLANT
TUBING CONTRAST HIGH PRESS 72 (TUBING) ×4 IMPLANT
WIRE J 3MM .035X145CM (WIRE) ×4 IMPLANT
WIRE MAGIC TORQUE 260C (WIRE) ×4 IMPLANT

## 2016-03-03 NOTE — Op Note (Signed)
[facility] Percutaneous Study/Intervention Procedural Note   Date of Surgery: 03/03/2016  Surgeon:  Renford DillsGregory G. Harles Evetts, MD.  Pre-operative Diagnosis: Atherosclerotic occlusive disease bilateral lower extremities with lifestyle limiting claudication right leg  Post-operative diagnosis: Same  Procedure(s) Performed: 1. Introduction catheter into right lower extremity 3rd order catheter placement  2. Contrast injection right lower extremity for distal runoff   3. Percutaneous transluminal angioplasty  right superficial femoral artery and popliteal 4. Minx close closure left common femoral arteriotomy  Anesthesia: Conscious sedation was administered under my direct supervision. IV Versed plus fentanyl were utilized. Continuous ECG, pulse oximetry and blood pressure was monitored throughout the entire procedure. Conscious sedation was for a total of 70 minutes.  Sheath: 6 French Rabi left common femoral artery  Contrast: 70 cc  Fluoroscopy Time: 4.1 minutes  Indications: Harold HedgeGeorge D Wadlow presents with lifestyle limiting claudication. 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 He therefore wishes to undergo treatment. The risks and benefits are reviewed all questions answered patient agrees to proceed.  Procedure: Harold HedgeGeorge D Nguyenthi is a 55 y.o. y.o. male who was identified and appropriate procedural time out was performed. The patient was then placed supine on the table and prepped and draped in the usual sterile fashion.   Ultrasound was placed in the sterile sleeve and the left groin was evaluated the left common femoral artery was echolucent and pulsatile indicating patency.  Image was recorded for the permanent record and under real-time visualization a microneedle was inserted into the common femoral artery microwire followed by a micro-sheath.   A J-wire was then advanced through the micro-sheath and a  5 [place]French sheath was then inserted over a J-wire. J-wire was then advanced and a 5 French pigtail catheter was positioned at the level of L3. LAO projection of the distal aorta was then obtained and of the pelvis was obtained.  Subsequently a pigtail catheter with the stiff angle Glidewire was used to cross the aortic bifurcation the catheter wire were advanced down into the right distal external iliac artery. Oblique view of the femoral bifurcation was then obtained and subsequently the wire was reintroduced and the pigtail catheter negotiated into the SFA representing third order catheter placement. Distal runoff was then performed.  5000 units of heparin was then given and allowed to circulate and a 6 JamaicaFrench Rabi sheath was advanced up and over the bifurcation and positioned in the right proximal superficial femoral artery  KMP  catheter and a Magic torque wire were then negotiated down into the distal popliteal.  Distal runoff was then completed by hand injection through the catheter. The wire was then reintroduced and a 5 x 40 Lutonix balloon was used to angioplasty the superficial femoral artery. Inflations were to 14 atmospheres for 2 minutes. Follow-up imaging demonstrated patency with excellent result and less than 5% residual stenosis. Distal runoff was then reassessed.  After review of these images the sheath is pulled into the left external iliac oblique of the common femoral is obtained and a minx device deployed. There no immediate complications.   Findings: The common and external iliac arteries are widely patent bilaterally.  The right common femoral is widely patent as is the profunda femoris.  The SFA does indeed have a significant stenosis of greater than 90% just at Hunter's canal..  The distal popliteal demonstrates wide patency and there is three-vessel runoff to the ankle with an intact pedal  arch.   Following angioplasty  the SFA is widely patent with less than 5% residual stenosis. Stent is not indicated at this time.     Summary: Successful recanalization right  Lower extremity  for limb salvage    Disposition: Patient was taken to the recovery room in stable condition having tolerated the procedure well.  Oron Westrup, Latina Craver 03/03/2016,11:45 AM

## 2016-03-03 NOTE — H&P (Signed)
[facility] History & Physical Update  The patient was interviewed and re-examined.  The patient's previous History and Physical has been reviewed and is unchanged.  There is no change in the plan of care. We plan to proceed with the scheduled procedure.  Levora DredgeGregory Voris Tigert, MD  03/03/2016, 8:57 AM

## 2016-03-03 NOTE — OR Nursing (Signed)
1023 returned from VIR, audible expiratory wheezes noted, resumed 2 liters [address], Dr Gilda CreaseSchnier notified, duoneb ordered. Respiratory therapy call and administered.

## 2016-03-03 NOTE — Discharge Instructions (Signed)
Angiogram, Care After °Refer to this sheet in the next few weeks. These instructions provide you with information about caring for yourself after your procedure. Your health care provider may also give you more specific instructions. Your treatment has been planned according to current medical practices, but problems sometimes occur. Call your health care provider if you have any problems or questions after your procedure. °WHAT TO EXPECT AFTER THE PROCEDURE °After your procedure, it is typical to have the following: °· Bruising at the catheter insertion site that usually fades within 1-2 weeks. °· Blood collecting in the tissue (hematoma) that may be painful to the touch. It should usually decrease in size and tenderness within 1-2 weeks. °HOME CARE INSTRUCTIONS °· Take medicines only as directed by your health care provider. °· You may shower 24-48 hours after the procedure or as directed by your health care provider. Remove the bandage (dressing) and gently wash the site with plain soap and water. Pat the area dry with a clean towel. Do not rub the site, because this may cause bleeding. °· Do not take baths, swim, or use a hot tub until your health care provider approves. °· Check your insertion site every day for redness, swelling, or drainage. °· Do not apply powder or lotion to the site. °· Do not lift over 10 lb (4.5 kg) for 5 days after your procedure or as directed by your health care provider. °· Ask your health care provider when it is okay to: °¨ Return to work or school. °¨ Resume usual physical activities or sports. °¨ Resume sexual activity. °· Do not drive home if you are discharged the same day as the procedure. Have someone else drive you. °· You may drive 24 hours after the procedure unless otherwise instructed by your health care provider. °· Do not operate machinery or power tools for 24 hours after the procedure or as directed by your health care provider. °· If your procedure was done as an  outpatient procedure, which means that you went home the same day as your procedure, a responsible adult should be with you for the first 24 hours after you arrive home. °· Keep all follow-up visits as directed by your health care provider. This is important. °SEEK MEDICAL CARE IF: °· You have a fever. °· You have chills. °· You have increased bleeding from the catheter insertion site. Hold pressure on the site. °SEEK IMMEDIATE MEDICAL CARE IF: °· You have unusual pain at the catheter insertion site. °· You have redness, warmth, or swelling at the catheter insertion site. °· You have drainage (other than a small amount of blood on the dressing) from the catheter insertion site. °· The catheter insertion site is bleeding, and the bleeding does not stop after 30 minutes of holding steady pressure on the site. °· The area near or just beyond the catheter insertion site becomes pale, cool, tingly, or numb. °  °This information is not intended to replace advice given to you by your health care provider. Make sure you discuss any questions you have with your health care provider. °  °Document Released: 11/20/2004 Document Revised: 05/25/2014 Document Reviewed: 10/05/2012 °Elsevier Interactive Patient Education ©2016 [employer] ° °

## 2016-03-04 ENCOUNTER — Encounter: Payer: Self-pay | Admitting: Vascular Surgery

## 2016-03-16 ENCOUNTER — Other Ambulatory Visit (INDEPENDENT_AMBULATORY_CARE_PROVIDER_SITE_OTHER): Payer: Self-pay | Admitting: Vascular Surgery

## 2016-03-16 DIAGNOSIS — I739 Peripheral vascular disease, unspecified: Secondary | ICD-10-CM

## 2016-03-30 ENCOUNTER — Ambulatory Visit (INDEPENDENT_AMBULATORY_CARE_PROVIDER_SITE_OTHER): Payer: Self-pay | Admitting: Vascular Surgery

## 2016-03-30 ENCOUNTER — Encounter (INDEPENDENT_AMBULATORY_CARE_PROVIDER_SITE_OTHER): Payer: Self-pay

## 2016-05-20 ENCOUNTER — Ambulatory Visit (INDEPENDENT_AMBULATORY_CARE_PROVIDER_SITE_OTHER): Payer: BLUE CROSS/BLUE SHIELD

## 2016-05-20 ENCOUNTER — Encounter (INDEPENDENT_AMBULATORY_CARE_PROVIDER_SITE_OTHER): Payer: Self-pay

## 2016-05-20 ENCOUNTER — Encounter (INDEPENDENT_AMBULATORY_CARE_PROVIDER_SITE_OTHER): Payer: BLUE CROSS/BLUE SHIELD | Admitting: Vascular Surgery

## 2016-05-20 DIAGNOSIS — I739 Peripheral vascular disease, unspecified: Secondary | ICD-10-CM | POA: Diagnosis not present

## 2016-05-25 ENCOUNTER — Telehealth (INDEPENDENT_AMBULATORY_CARE_PROVIDER_SITE_OTHER): Payer: Self-pay | Admitting: Vascular Surgery

## 2016-05-25 NOTE — Telephone Encounter (Signed)
Left message asking for call back to discuss ultrasound results.  

## 2016-05-26 ENCOUNTER — Other Ambulatory Visit (INDEPENDENT_AMBULATORY_CARE_PROVIDER_SITE_OTHER): Payer: Self-pay | Admitting: Vascular Surgery

## 2016-05-26 ENCOUNTER — Telehealth (INDEPENDENT_AMBULATORY_CARE_PROVIDER_SITE_OTHER): Payer: Self-pay | Admitting: Vascular Surgery

## 2016-05-26 DIAGNOSIS — I739 Peripheral vascular disease, unspecified: Secondary | ICD-10-CM

## 2016-05-26 NOTE — Telephone Encounter (Signed)
Discussed patients results. ABI and RLE Arterial Duplex normal. Patient without complaint. Will see patient back in six months for ABI and RLE Arterial Duplex

## 2016-08-06 ENCOUNTER — Other Ambulatory Visit: Payer: Self-pay | Admitting: Physician Assistant

## 2016-08-06 DIAGNOSIS — R531 Weakness: Secondary | ICD-10-CM

## 2016-08-19 ENCOUNTER — Ambulatory Visit
Admission: RE | Admit: 2016-08-19 | Discharge: 2016-08-19 | Disposition: A | Discharge status: Home / Self Care | Payer: BLUE CROSS/BLUE SHIELD | Source: Ambulatory Visit | Attending: Physician Assistant | Admitting: Physician Assistant

## 2016-08-19 DIAGNOSIS — R531 Weakness: Secondary | ICD-10-CM | POA: Diagnosis not present

## 2016-08-19 DIAGNOSIS — R490 Dysphonia: Secondary | ICD-10-CM | POA: Diagnosis present

## 2016-11-25 ENCOUNTER — Ambulatory Visit (INDEPENDENT_AMBULATORY_CARE_PROVIDER_SITE_OTHER): Payer: BLUE CROSS/BLUE SHIELD | Admitting: Vascular Surgery

## 2016-11-25 ENCOUNTER — Encounter (INDEPENDENT_AMBULATORY_CARE_PROVIDER_SITE_OTHER): Payer: BLUE CROSS/BLUE SHIELD

## 2020-08-21 ENCOUNTER — Inpatient Hospital Stay
Admission: EM | Admit: 2020-08-21 | Discharge: 2020-09-15 | DRG: 246 | Disposition: E | Payer: Self-pay | Attending: Internal Medicine | Admitting: Internal Medicine

## 2020-08-21 ENCOUNTER — Encounter: Admission: EM | Disposition: E | Payer: Self-pay | Source: Home / Self Care | Attending: Internal Medicine

## 2020-08-21 ENCOUNTER — Emergency Department: Payer: Self-pay

## 2020-08-21 DIAGNOSIS — Z452 Encounter for adjustment and management of vascular access device: Secondary | ICD-10-CM

## 2020-08-21 DIAGNOSIS — E669 Obesity, unspecified: Secondary | ICD-10-CM | POA: Diagnosis present

## 2020-08-21 DIAGNOSIS — J449 Chronic obstructive pulmonary disease, unspecified: Secondary | ICD-10-CM | POA: Diagnosis present

## 2020-08-21 DIAGNOSIS — D631 Anemia in chronic kidney disease: Secondary | ICD-10-CM | POA: Diagnosis present

## 2020-08-21 DIAGNOSIS — Z86718 Personal history of other venous thrombosis and embolism: Secondary | ICD-10-CM

## 2020-08-21 DIAGNOSIS — Z978 Presence of other specified devices: Secondary | ICD-10-CM

## 2020-08-21 DIAGNOSIS — R230 Cyanosis: Secondary | ICD-10-CM

## 2020-08-21 DIAGNOSIS — I4891 Unspecified atrial fibrillation: Secondary | ICD-10-CM

## 2020-08-21 DIAGNOSIS — I130 Hypertensive heart and chronic kidney disease with heart failure and stage 1 through stage 4 chronic kidney disease, or unspecified chronic kidney disease: Secondary | ICD-10-CM | POA: Diagnosis present

## 2020-08-21 DIAGNOSIS — R7989 Other specified abnormal findings of blood chemistry: Secondary | ICD-10-CM | POA: Diagnosis present

## 2020-08-21 DIAGNOSIS — Z7982 Long term (current) use of aspirin: Secondary | ICD-10-CM

## 2020-08-21 DIAGNOSIS — I959 Hypotension, unspecified: Secondary | ICD-10-CM | POA: Diagnosis present

## 2020-08-21 DIAGNOSIS — R410 Disorientation, unspecified: Secondary | ICD-10-CM

## 2020-08-21 DIAGNOSIS — E875 Hyperkalemia: Secondary | ICD-10-CM | POA: Diagnosis not present

## 2020-08-21 DIAGNOSIS — Z7902 Long term (current) use of antithrombotics/antiplatelets: Secondary | ICD-10-CM

## 2020-08-21 DIAGNOSIS — N179 Acute kidney failure, unspecified: Secondary | ICD-10-CM

## 2020-08-21 DIAGNOSIS — Z0189 Encounter for other specified special examinations: Secondary | ICD-10-CM

## 2020-08-21 DIAGNOSIS — I462 Cardiac arrest due to underlying cardiac condition: Secondary | ICD-10-CM | POA: Diagnosis present

## 2020-08-21 DIAGNOSIS — I2510 Atherosclerotic heart disease of native coronary artery without angina pectoris: Secondary | ICD-10-CM | POA: Diagnosis present

## 2020-08-21 DIAGNOSIS — N182 Chronic kidney disease, stage 2 (mild): Secondary | ICD-10-CM | POA: Diagnosis present

## 2020-08-21 DIAGNOSIS — R570 Cardiogenic shock: Secondary | ICD-10-CM | POA: Diagnosis present

## 2020-08-21 DIAGNOSIS — Z66 Do not resuscitate: Secondary | ICD-10-CM | POA: Diagnosis not present

## 2020-08-21 DIAGNOSIS — I509 Heart failure, unspecified: Secondary | ICD-10-CM

## 2020-08-21 DIAGNOSIS — I2102 ST elevation (STEMI) myocardial infarction involving left anterior descending coronary artery: Principal | ICD-10-CM | POA: Diagnosis present

## 2020-08-21 DIAGNOSIS — M62261 Nontraumatic ischemic infarction of muscle, right lower leg: Secondary | ICD-10-CM

## 2020-08-21 DIAGNOSIS — J9602 Acute respiratory failure with hypercapnia: Secondary | ICD-10-CM | POA: Diagnosis present

## 2020-08-21 DIAGNOSIS — J9601 Acute respiratory failure with hypoxia: Secondary | ICD-10-CM

## 2020-08-21 DIAGNOSIS — I739 Peripheral vascular disease, unspecified: Secondary | ICD-10-CM | POA: Diagnosis present

## 2020-08-21 DIAGNOSIS — I5041 Acute combined systolic (congestive) and diastolic (congestive) heart failure: Secondary | ICD-10-CM | POA: Diagnosis present

## 2020-08-21 DIAGNOSIS — U071 COVID-19: Secondary | ICD-10-CM

## 2020-08-21 DIAGNOSIS — I998 Other disorder of circulatory system: Secondary | ICD-10-CM | POA: Diagnosis present

## 2020-08-21 DIAGNOSIS — N170 Acute kidney failure with tubular necrosis: Secondary | ICD-10-CM | POA: Diagnosis present

## 2020-08-21 DIAGNOSIS — I255 Ischemic cardiomyopathy: Secondary | ICD-10-CM | POA: Diagnosis present

## 2020-08-21 DIAGNOSIS — R7401 Elevation of levels of liver transaminase levels: Secondary | ICD-10-CM

## 2020-08-21 DIAGNOSIS — Z7952 Long term (current) use of systemic steroids: Secondary | ICD-10-CM

## 2020-08-21 DIAGNOSIS — K7200 Acute and subacute hepatic failure without coma: Secondary | ICD-10-CM | POA: Diagnosis present

## 2020-08-21 DIAGNOSIS — K219 Gastro-esophageal reflux disease without esophagitis: Secondary | ICD-10-CM | POA: Diagnosis present

## 2020-08-21 DIAGNOSIS — F17200 Nicotine dependence, unspecified, uncomplicated: Secondary | ICD-10-CM

## 2020-08-21 DIAGNOSIS — Z6838 Body mass index (BMI) 38.0-38.9, adult: Secondary | ICD-10-CM

## 2020-08-21 DIAGNOSIS — Z79899 Other long term (current) drug therapy: Secondary | ICD-10-CM

## 2020-08-21 DIAGNOSIS — I2129 ST elevation (STEMI) myocardial infarction involving other sites: Secondary | ICD-10-CM

## 2020-08-21 DIAGNOSIS — I469 Cardiac arrest, cause unspecified: Secondary | ICD-10-CM

## 2020-08-21 DIAGNOSIS — E785 Hyperlipidemia, unspecified: Secondary | ICD-10-CM | POA: Diagnosis present

## 2020-08-21 DIAGNOSIS — Z8249 Family history of ischemic heart disease and other diseases of the circulatory system: Secondary | ICD-10-CM

## 2020-08-21 DIAGNOSIS — F1010 Alcohol abuse, uncomplicated: Secondary | ICD-10-CM | POA: Diagnosis present

## 2020-08-21 DIAGNOSIS — I4901 Ventricular fibrillation: Secondary | ICD-10-CM | POA: Diagnosis present

## 2020-08-21 DIAGNOSIS — G928 Other toxic encephalopathy: Secondary | ICD-10-CM | POA: Diagnosis present

## 2020-08-21 DIAGNOSIS — Z62890 Parent-child estrangement NEC: Secondary | ICD-10-CM | POA: Diagnosis present

## 2020-08-21 DIAGNOSIS — W07XXXA Fall from chair, initial encounter: Secondary | ICD-10-CM | POA: Diagnosis present

## 2020-08-21 DIAGNOSIS — Z515 Encounter for palliative care: Secondary | ICD-10-CM

## 2020-08-21 HISTORY — PX: LEFT HEART CATH AND CORONARY ANGIOGRAPHY: CATH118249

## 2020-08-21 HISTORY — PX: CORONARY/GRAFT ACUTE MI REVASCULARIZATION: CATH118305

## 2020-08-21 LAB — COMPREHENSIVE METABOLIC PANEL
ALT: 59 U/L — ABNORMAL HIGH (ref 0–44)
AST: 73 U/L — ABNORMAL HIGH (ref 15–41)
Albumin: 2.9 g/dL — ABNORMAL LOW (ref 3.5–5.0)
Alkaline Phosphatase: 96 U/L (ref 38–126)
Anion gap: 19 — ABNORMAL HIGH (ref 5–15)
BUN: 16 mg/dL (ref 8–23)
CO2: 19 mmol/L — ABNORMAL LOW (ref 22–32)
Calcium: 8.6 mg/dL — ABNORMAL LOW (ref 8.9–10.3)
Chloride: 101 mmol/L (ref 98–111)
Creatinine, Ser: 1.63 mg/dL — ABNORMAL HIGH (ref 0.61–1.24)
GFR, Estimated: 48 mL/min — ABNORMAL LOW (ref >60)
Glucose, Bld: 269 mg/dL — ABNORMAL HIGH (ref 70–99)
Potassium: 4.1 mmol/L (ref 3.5–5.1)
Sodium: 139 mmol/L (ref 135–145)
Total Bilirubin: 0.8 mg/dL (ref 0.3–1.2)
Total Protein: 6.2 g/dL — ABNORMAL LOW (ref 6.5–8.1)

## 2020-08-21 LAB — CBC WITH DIFFERENTIAL/PLATELET
Abs Immature Granulocytes: 0.76 10*3/uL — ABNORMAL HIGH (ref 0.00–0.07)
Basophils Absolute: 0.1 10*3/uL (ref 0.0–0.1)
Basophils Relative: 0 %
Eosinophils Absolute: 0.2 10*3/uL (ref 0.0–0.5)
Eosinophils Relative: 1 %
HCT: 39.6 % (ref 39.0–52.0)
Hemoglobin: 12.5 g/dL — ABNORMAL LOW (ref 13.0–17.0)
Immature Granulocytes: 4 %
Lymphocytes Relative: 37 %
Lymphs Abs: 6.6 10*3/uL — ABNORMAL HIGH (ref 0.7–4.0)
MCH: 30.3 pg (ref 26.0–34.0)
MCHC: 31.6 g/dL (ref 30.0–36.0)
MCV: 96.1 fL (ref 80.0–100.0)
Monocytes Absolute: 1.2 10*3/uL — ABNORMAL HIGH (ref 0.1–1.0)
Monocytes Relative: 7 %
Neutro Abs: 8.9 10*3/uL — ABNORMAL HIGH (ref 1.7–7.7)
Neutrophils Relative %: 51 %
Platelets: 286 10*3/uL (ref 150–400)
RBC: 4.12 MIL/uL — ABNORMAL LOW (ref 4.22–5.81)
RDW: 12.7 % (ref 11.5–15.5)
Smear Review: NORMAL
WBC: 17.7 10*3/uL — ABNORMAL HIGH (ref 4.0–10.5)
nRBC: 0.1 % (ref 0.0–0.2)

## 2020-08-21 LAB — D-DIMER, QUANTITATIVE: D-Dimer, Quant: 11.35 ug/mL-FEU — ABNORMAL HIGH (ref 0.00–0.50)

## 2020-08-21 LAB — BLOOD GAS, ARTERIAL
Acid-base deficit: 5.5 mmol/L — ABNORMAL HIGH (ref 0.0–2.0)
Bicarbonate: 24.7 mmol/L (ref 20.0–28.0)
FIO2: 100
MECHVT: 500 mL
O2 Saturation: 80.3 %
PEEP: 10 cmH2O
Patient temperature: 37
RATE: 20 resp/min
pCO2 arterial: 76 mmHg (ref 32.0–48.0)
pH, Arterial: 7.12 — CL (ref 7.350–7.450)
pO2, Arterial: 60 mmHg — ABNORMAL LOW (ref 83.0–108.0)

## 2020-08-21 LAB — BRAIN NATRIURETIC PEPTIDE: B Natriuretic Peptide: 1790.7 pg/mL — ABNORMAL HIGH (ref 0.0–100.0)

## 2020-08-21 LAB — POCT ACTIVATED CLOTTING TIME: Activated Clotting Time: 517 seconds

## 2020-08-21 LAB — MAGNESIUM: Magnesium: 2.4 mg/dL (ref 1.7–2.4)

## 2020-08-21 LAB — TROPONIN I (HIGH SENSITIVITY)
Troponin I (High Sensitivity): 2918 ng/L (ref <18)
Troponin I (High Sensitivity): 3591 ng/L (ref <18)

## 2020-08-21 LAB — PROTIME-INR
INR: 1.3 — ABNORMAL HIGH (ref 0.8–1.2)
Prothrombin Time: 15.2 seconds (ref 11.4–15.2)

## 2020-08-21 LAB — RESP PANEL BY RT-PCR (FLU A&B, COVID) ARPGX2
Influenza A by PCR: NEGATIVE
Influenza B by PCR: NEGATIVE
SARS Coronavirus 2 by RT PCR: POSITIVE — AB

## 2020-08-21 LAB — APTT: aPTT: 35 seconds (ref 24–36)

## 2020-08-21 LAB — GLUCOSE, CAPILLARY: Glucose-Capillary: 237 mg/dL — ABNORMAL HIGH (ref 70–99)

## 2020-08-21 SURGERY — CORONARY/GRAFT ACUTE MI REVASCULARIZATION
Anesthesia: Moderate Sedation

## 2020-08-21 MED ORDER — PROPOFOL 1000 MG/100ML IV EMUL: 20.0000 ug/kg/min | INTRAVENOUS | Status: DC

## 2020-08-21 MED ORDER — METHYLPREDNISOLONE SODIUM SUCC 125 MG IJ SOLR: 0.5000 mg/kg | Freq: Two times a day (BID) | INTRAMUSCULAR | Status: DC

## 2020-08-21 MED ORDER — ETOMIDATE 2 MG/ML IV SOLN
20.0000 mg | Freq: Once | INTRAVENOUS | Status: AC
  Administered 2020-08-21: 20 mg via INTRAVENOUS

## 2020-08-21 MED ORDER — TICAGRELOR 90 MG PO TABS
ORAL_TABLET | ORAL | Status: DC | PRN
  Administered 2020-08-21: 180 mg

## 2020-08-21 MED ORDER — PREDNISONE 20 MG PO TABS: 50.0000 mg | ORAL_TABLET | Freq: Every day | ORAL | Status: DC

## 2020-08-21 MED ORDER — ROCURONIUM BROMIDE 50 MG/5ML IV SOLN
100.0000 mg | Freq: Once | INTRAVENOUS | Status: AC
  Administered 2020-08-21: 100 mg via INTRAVENOUS

## 2020-08-21 MED ORDER — SODIUM BICARBONATE 8.4 % IV SOLN
100.0000 meq | Freq: Once | INTRAVENOUS | Status: AC
  Administered 2020-08-21: 100 meq via INTRAVENOUS

## 2020-08-21 MED ORDER — LIDOCAINE HCL (PF) 1 % IJ SOLN
INTRAMUSCULAR | Status: DC | PRN
  Administered 2020-08-21: 10 mL

## 2020-08-21 MED ORDER — NITROGLYCERIN 1 MG/10 ML FOR IR/CATH LAB
INTRA_ARTERIAL | Status: AC
  Filled 2020-08-21: qty 10

## 2020-08-21 MED ORDER — FENTANYL CITRATE (PF) 100 MCG/2ML IJ SOLN
INTRAMUSCULAR | Status: AC
  Filled 2020-08-21: qty 2

## 2020-08-21 MED ORDER — AMIODARONE HCL 150 MG/3ML IV SOLN
INTRAVENOUS | Status: AC
  Filled 2020-08-21: qty 6

## 2020-08-21 MED ORDER — SODIUM CHLORIDE 0.9 % IV SOLN
200.0000 mg | Freq: Once | INTRAVENOUS | Status: AC
  Administered 2020-08-22: 200 mg via INTRAVENOUS
  Filled 2020-08-21: qty 40

## 2020-08-21 MED ORDER — PROPOFOL 1000 MG/100ML IV EMUL
5.0000 ug/kg/min | INTRAVENOUS | Status: DC
  Administered 2020-08-21: 40 ug/kg/min via INTRAVENOUS

## 2020-08-21 MED ORDER — ZINC SULFATE 220 (50 ZN) MG PO CAPS
220.0000 mg | ORAL_CAPSULE | Freq: Every day | ORAL | Status: DC
  Filled 2020-08-21: qty 1

## 2020-08-21 MED ORDER — AMIODARONE HCL IN DEXTROSE 360-4.14 MG/200ML-% IV SOLN
30.0000 mg/h | INTRAVENOUS | Status: DC
  Administered 2020-08-22 – 2020-08-23 (×4): 30 mg/h via INTRAVENOUS
  Filled 2020-08-21 (×3): qty 200

## 2020-08-21 MED ORDER — METHYLPREDNISOLONE SODIUM SUCC 125 MG IJ SOLR
60.0000 mg | Freq: Two times a day (BID) | INTRAMUSCULAR | Status: DC
  Administered 2020-08-21: 60 mg via INTRAVENOUS

## 2020-08-21 MED ORDER — FENTANYL 2500MCG IN NS 250ML (10MCG/ML) PREMIX INFUSION
INTRAVENOUS | Status: AC
  Administered 2020-08-21: 100 ug/h via INTRAVENOUS
  Filled 2020-08-21: qty 250

## 2020-08-21 MED ORDER — SODIUM CHLORIDE 0.9 % IV SOLN
0.2500 mg/kg/h | INTRAVENOUS | Status: AC
  Administered 2020-08-22: 0.25 mg/kg/h via INTRAVENOUS
  Filled 2020-08-21: qty 250

## 2020-08-21 MED ORDER — METHYLPREDNISOLONE SODIUM SUCC 125 MG IJ SOLR
INTRAMUSCULAR | Status: AC
  Filled 2020-08-21: qty 2

## 2020-08-21 MED ORDER — FENTANYL 2500MCG IN NS 250ML (10MCG/ML) PREMIX INFUSION
0.0000 ug/h | INTRAVENOUS | Status: DC
  Administered 2020-08-22 (×3): 150 ug/h via INTRAVENOUS
  Administered 2020-08-23 – 2020-08-24 (×2): 200 ug/h via INTRAVENOUS
  Administered 2020-08-25: 175 ug/h via INTRAVENOUS
  Administered 2020-08-25: 150 ug/h via INTRAVENOUS
  Filled 2020-08-21 (×7): qty 250

## 2020-08-21 MED ORDER — BIVALIRUDIN BOLUS VIA INFUSION - CUPID
INTRAVENOUS | Status: DC | PRN
  Administered 2020-08-21: 85.05 mg via INTRAVENOUS

## 2020-08-21 MED ORDER — NITROGLYCERIN 1 MG/10 ML FOR IR/CATH LAB
INTRA_ARTERIAL | Status: DC | PRN
  Administered 2020-08-21: 200 ug

## 2020-08-21 MED ORDER — NOREPINEPHRINE BITARTRATE 1 MG/ML IV SOLN
INTRAVENOUS | Status: AC | PRN
  Administered 2020-08-21: 10 ug/min via INTRAVENOUS

## 2020-08-21 MED ORDER — CANGRELOR BOLUS VIA INFUSION
INTRAVENOUS | Status: DC | PRN
  Administered 2020-08-21: 3402 ug via INTRAVENOUS

## 2020-08-21 MED ORDER — NOREPINEPHRINE 4 MG/250ML-% IV SOLN
2.0000 ug/min | INTRAVENOUS | Status: DC
Start: 2020-08-22 — End: 2020-08-22
  Administered 2020-08-21: 6 ug/min via INTRAVENOUS

## 2020-08-21 MED ORDER — FOLIC ACID 5 MG/ML IJ SOLN
1.0000 mg | Freq: Every day | INTRAMUSCULAR | Status: DC
  Administered 2020-08-22 – 2020-08-25 (×4): 1 mg via INTRAVENOUS
  Filled 2020-08-21 (×5): qty 0.2

## 2020-08-21 MED ORDER — ASCORBIC ACID 500 MG PO TABS
500.0000 mg | ORAL_TABLET | Freq: Every day | ORAL | Status: DC
  Filled 2020-08-21: qty 1

## 2020-08-21 MED ORDER — VECURONIUM BROMIDE 10 MG IV SOLR
10.0000 mg | INTRAVENOUS | Status: DC | PRN
  Administered 2020-08-21: 10 mg via INTRAVENOUS

## 2020-08-21 MED ORDER — ASPIRIN 81 MG PO CHEW
CHEWABLE_TABLET | ORAL | Status: AC
  Filled 2020-08-21: qty 4

## 2020-08-21 MED ORDER — SODIUM CHLORIDE 0.9 % IV SOLN
INTRAVENOUS | Status: AC | PRN
  Administered 2020-08-21: 4 ug/kg/min via INTRAVENOUS

## 2020-08-21 MED ORDER — FENTANYL CITRATE (PF) 100 MCG/2ML IJ SOLN: 100.0000 ug | Freq: Once | INTRAMUSCULAR | Status: DC

## 2020-08-21 MED ORDER — BIVALIRUDIN TRIFLUOROACETATE 250 MG IV SOLR
INTRAVENOUS | Status: AC
  Filled 2020-08-21: qty 250

## 2020-08-21 MED ORDER — SODIUM CHLORIDE 0.9 % IV SOLN
INTRAVENOUS | Status: AC | PRN
  Administered 2020-08-21: 1.75 mg/kg/h via INTRAVENOUS

## 2020-08-21 MED ORDER — TICAGRELOR 90 MG PO TABS
ORAL_TABLET | ORAL | Status: AC
  Filled 2020-08-21: qty 1

## 2020-08-21 MED ORDER — AMIODARONE HCL IN DEXTROSE 360-4.14 MG/200ML-% IV SOLN
60.0000 mg/h | INTRAVENOUS | Status: AC
  Administered 2020-08-21 – 2020-08-22 (×2): 60 mg/h via INTRAVENOUS
  Filled 2020-08-21: qty 200

## 2020-08-21 MED ORDER — NOREPINEPHRINE BITARTRATE 1 MG/ML IV SOLN
INTRAVENOUS | Status: AC
  Filled 2020-08-21: qty 4

## 2020-08-21 MED ORDER — HEPARIN (PORCINE) IN NACL 1000-0.9 UT/500ML-% IV SOLN
INTRAVENOUS | Status: DC | PRN
  Administered 2020-08-21 (×2): 500 mL

## 2020-08-21 MED ORDER — AMIODARONE HCL 150 MG/3ML IV SOLN
INTRAVENOUS | Status: DC | PRN
  Administered 2020-08-21: 300 mg via INTRAVENOUS

## 2020-08-21 MED ORDER — SODIUM CHLORIDE 0.9 % IV SOLN: 250.0000 mL | INTRAVENOUS | Status: DC

## 2020-08-21 MED ORDER — SODIUM CHLORIDE 0.9 % IV SOLN
100.0000 mg | Freq: Every day | INTRAVENOUS | Status: DC
  Administered 2020-08-22: 100 mg via INTRAVENOUS
  Filled 2020-08-21: qty 20

## 2020-08-21 MED ORDER — SODIUM CHLORIDE 0.9 % IV SOLN: INTRAVENOUS | Status: DC | PRN

## 2020-08-21 MED ORDER — THIAMINE HCL 100 MG/ML IJ SOLN
100.0000 mg | Freq: Every day | INTRAMUSCULAR | Status: DC
  Administered 2020-08-22 – 2020-08-25 (×4): 100 mg via INTRAVENOUS
  Filled 2020-08-21 (×4): qty 2

## 2020-08-21 MED ORDER — HEPARIN (PORCINE) IN NACL 1000-0.9 UT/500ML-% IV SOLN
INTRAVENOUS | Status: AC
  Filled 2020-08-21: qty 1000

## 2020-08-21 MED ORDER — MIDAZOLAM HCL 2 MG/2ML IJ SOLN
INTRAMUSCULAR | Status: AC
  Filled 2020-08-21: qty 2

## 2020-08-21 MED ORDER — IOHEXOL 300 MG/ML  SOLN
INTRAMUSCULAR | Status: DC | PRN
  Administered 2020-08-21: 250 mL

## 2020-08-21 MED ORDER — CANGRELOR TETRASODIUM 50 MG IV SOLR
INTRAVENOUS | Status: AC
  Filled 2020-08-21: qty 50

## 2020-08-21 MED ORDER — VECURONIUM BROMIDE 10 MG IV SOLR
INTRAVENOUS | Status: AC
  Filled 2020-08-21: qty 10

## 2020-08-21 MED ORDER — ASPIRIN 81 MG PO CHEW
CHEWABLE_TABLET | ORAL | Status: DC | PRN
  Administered 2020-08-21: 324 mg via ORAL

## 2020-08-21 MED ORDER — PROPOFOL 1000 MG/100ML IV EMUL
INTRAVENOUS | Status: AC
  Administered 2020-08-21: 30 ug/kg/min via INTRAVENOUS
  Filled 2020-08-21: qty 100

## 2020-08-21 SURGICAL SUPPLY — 15 items
BALLN TREK RX 2.5X15 (BALLOONS) ×2
BALLOON TREK RX 2.5X15 (BALLOONS) ×1 IMPLANT
CATH INFINITI 5FR JL4 (CATHETERS) ×2 IMPLANT
CATH INFINITI JR4 5F (CATHETERS) ×2 IMPLANT
CATH VISTA GUIDE 6FR XB3.5 (CATHETERS) ×2 IMPLANT
DEVICE CLOSURE MYNXGRIP 6/7F (Vascular Products) ×2 IMPLANT
KIT ENCORE 26 ADVANTAGE (KITS) ×2 IMPLANT
KIT MANI 3VAL PERCEP (MISCELLANEOUS) ×2 IMPLANT
NEEDLE PERC 18GX7CM (NEEDLE) ×2 IMPLANT
PACK CARDIAC CATH (CUSTOM PROCEDURE TRAY) ×2 IMPLANT
SHEATH AVANTI 6FR X 11CM (SHEATH) ×2 IMPLANT
STENT RESOLUTE ONYX 2.25X30 (Permanent Stent) ×2 IMPLANT
STENT RESOLUTE ONYX 2.5X30 (Permanent Stent) ×2 IMPLANT
WIRE G HI TQ BMW 190 (WIRE) ×2 IMPLANT
WIRE GUIDERIGHT .035X150 (WIRE) ×2 IMPLANT

## 2020-08-21 NOTE — ED Provider Notes (Signed)
[facility] Emergency Department Provider Note ____________________________________________   Event Date/Time   First MD Initiated Contact with Patient 09/05/2020 2049     (approximate)  I have reviewed the triage vital signs and the nursing notes.  HISTORY  Chief Complaint Chest Pain (Pt found down, not breathing Cpr initiated.)   HPI Vincent [name] is a 60 y.o. malewho presents to the ED for evaluation of post arrest care and concern for STEMI.   Chart review indicates history of obesity, CAD, COPD, HTN.  Patient presents to the ED via EMS after field activation of STEMI alert.   EMS reports that patient was seated at a local bar when he suddenly went unresponsive, and fell off his barstool.  EMS was called and first responders found the patient pulseless, CPR was started and AED was applied and 1 shock was immediately applied with subsequent ROSC.  Uncertain if any epinephrine was provided.   King airway was placed and patient had increasing agitation and delirium on route to the ED.  He was provided Versed, but despite this he ripped out the [facility] airway.  Patient presents to the ED altered, thrashing in the stretcher despite multiple people trying to hold him down, nonrebreather in place and not responsive to external stimuli.  History limited due to his mental status and acuity.  Patient presents in agitated delirium and thrashing about, not responsive to external stimuli or redirection, requiring immediate endotracheal intubation to facilitate appropriate care in the setting of field arrest and STEMI.   Past Medical History:  Diagnosis Date  . Alcoholic (HCC)   . Chronic kidney disease   . COPD (chronic obstructive pulmonary disease) (HCC)   . Coronary artery disease   . DVT (deep venous thrombosis) (HCC)   . Heartburn   . Hypertension   . Lymphedema   . Peripheral vascular disease (HCC)   . Varicose veins     Patient Active Problem List    Diagnosis Date Noted  . BPH with obstruction/lower urinary tract symptoms 10/05/2015  . Erectile dysfunction of organic origin 10/05/2015  . Hypogonadism in male 10/05/2015    Past Surgical History:  Procedure Laterality Date  . HERNIA REPAIR    . LOWER EXTREMITY ANGIOGRAM Bilateral   . PERIPHERAL VASCULAR CATHETERIZATION Left 09/30/2015   Procedure: Lower Extremity Angiography;  Surgeon: Annice Needy, MD;  Location: [facility] INVASIVE CV LAB;  Service: Cardiovascular;  Laterality: Left;  . PERIPHERAL VASCULAR CATHETERIZATION  09/30/2015   Procedure: Lower Extremity Intervention;  Surgeon: Annice Needy, MD;  Location: [facility] INVASIVE CV LAB;  Service: Cardiovascular;;  . PERIPHERAL VASCULAR CATHETERIZATION N/A 03/03/2016   Procedure: Lower Extremity Angiography;  Surgeon: Renford Dills, MD;  Location: [facility] INVASIVE CV LAB;  Service: Cardiovascular;  Laterality: N/A;  . PERIPHERAL VASCULAR CATHETERIZATION N/A 03/03/2016   Procedure: Abdominal Aortogram w/Lower Extremity;  Surgeon: Renford Dills, MD;  Location: [facility] INVASIVE CV LAB;  Service: Cardiovascular;  Laterality: N/A;  . PERIPHERAL VASCULAR CATHETERIZATION  03/03/2016   Procedure: Lower Extremity Intervention;  Surgeon: Renford Dills, MD;  Location: [facility] INVASIVE CV LAB;  Service: Cardiovascular;;    Prior to Admission medications   Medication Sig Start Date End Date Taking? Authorizing Provider  albuterol (PROVENTIL HFA;VENTOLIN HFA) 108 (90 Base) MCG/ACT inhaler Inhale 2 puffs into the lungs every 6 (six) hours as needed for wheezing or shortness of breath.    [provider]  amLODipine (NORVASC) 2.5 MG tablet Take 2.5 mg by  mouth daily.    [provider]  aspirin 81 MG tablet Take 81 mg by mouth daily.    [provider]  buPROPion (ZYBAN) 150 MG 12 hr tablet Take 150 mg by mouth 2 (two) times daily.    [provider]  cetirizine (ZYRTEC) 10 MG tablet Take 10 mg by mouth daily.    [provider]  clopidogrel (PLAVIX) 75 MG tablet Take 1 tablet (75 mg total) by mouth daily. 09/30/15   Annice Needy, MD  fluticasone (FLONASE) 50 MCG/ACT nasal spray Place 2 sprays into both nostrils daily as needed for allergies or rhinitis.    [provider]  hydrALAZINE (APRESOLINE) 10 MG tablet Take 40 mg by mouth daily.    [provider]  hydrochlorothiazide (HYDRODIURIL) 25 MG tablet Take 25 mg by mouth daily.    [provider]  ipratropium (ATROVENT HFA) 17 MCG/ACT inhaler Inhale 2 puffs into the lungs 3 (three) times daily.    [provider]  ipratropium-albuterol (DUONEB) 0.5-2.5 (3) MG/3ML SOLN Inhale into the lungs.    [provider]  lisinopril (PRINIVIL,ZESTRIL) 40 MG tablet  09/18/15   [provider]  omeprazole (PRILOSEC) 20 MG capsule Take 20 mg by mouth daily. Reported on 10/02/2015    [provider]  predniSONE (DELTASONE) 10 MG tablet  08/14/15   [provider]  sildenafil (VIAGRA) 50 MG tablet Take 50 mg by mouth as needed for erectile dysfunction. Reported on 10/02/2015    [provider]  tiotropium (SPIRIVA) 18 MCG inhalation capsule Place into inhaler and inhale.    [provider]    Allergies Patient has no known allergies.  Family History  Problem Relation Age of Onset  . Kidney disease Neg Hx   . Prostate cancer Neg Hx     Social History Social History   Tobacco Use  . Smoking status: Current Every Day Smoker    Packs/day: 1.00    Years: 49.00    Pack years: 49.00  . Smokeless tobacco: Never Used  Substance Use Topics  . Alcohol use: No  . Drug use: No    Review of Systems  Unable to be assessed due to acuity of condition and patient's mental status ____________________________________________  PHYSICAL EXAM:  VITAL SIGNS: Vitals:   2020-09-10 2045 2020-09-10 2050  BP: 99/70 (!) 165/90  Pulse: (!) 150 (!) 145  Resp: 14 17  SpO2:  96%     Constitutional: Supine, agitated and thrashing.  Not responsive to external stimuli. Eyes: Conjunctivae are normal.  Pupils are symmetric Head: Atraumatic. Nose: No congestion/rhinnorhea. Mouth/Throat: Mucous membranes are dry.  Oropharynx non-erythematous. Neck: No stridor. No cervical spine tenderness to palpation. Cardiovascular: Tachycardic rate, regular rhythm. Grossly normal heart sounds.  Good peripheral circulation with strong peripheral palpable pulses bilaterally DP pulses, symmetrically. Respiratory: Mild and scattered expiratory wheezes, good air movement throughout. Gastrointestinal: Soft , nondistended. Musculoskeletal: No lower extremity tenderness nor edema.  No joint effusions. No signs of acute trauma. IO to the right tibia Neurologic: Thrashing and agitated delirium, moving all 4 extremities without apparent deficit.  Not responding to external stimuli Skin:  Skin is warm, dry and intact. No rash noted. Psychiatric: Mood and affect are difficult to assess ____________________________________________   LABS (all labs ordered are listed, but only abnormal results are displayed)  Labs Reviewed  COMPREHENSIVE METABOLIC PANEL - Abnormal; Notable for the following components:      Result Value   CO2 19 (*)  Glucose, Bld 269 (*)    Creatinine, Ser 1.63 (*)    Calcium 8.6 (*)    Total Protein 6.2 (*)    Albumin 2.9 (*)    AST 73 (*)    ALT 59 (*)    GFR, Estimated 48 (*)    Anion gap 19 (*)    All other components within normal limits  CBC WITH DIFFERENTIAL/PLATELET - Abnormal; Notable for the following components:   WBC 17.7 (*)    RBC 4.12 (*)    Hemoglobin 12.5 (*)    All other components within normal limits  PROTIME-INR - Abnormal; Notable for the following components:   INR 1.3 (*)    All other components within normal limits  TROPONIN I (HIGH SENSITIVITY) - Abnormal; Notable for the following components:   Troponin I (High Sensitivity) 2,918 (*)    All  other components within normal limits  RESP PANEL BY RT-PCR (FLU A&B, COVID) ARPGX2  MAGNESIUM  APTT  URINALYSIS, COMPLETE (UACMP) WITH MICROSCOPIC  BLOOD GAS, ARTERIAL  D-DIMER, QUANTITATIVE   ____________________________________________  12 Lead EKG  Sinus tachycardia with anterior STEMI, normal intervals normal axis ____________________________________________  RADIOLOGY  ED MD interpretation: 1 view CXR reviewed by me with ETT and OGT in appropriate position  Official radiology report(s): DG Chest Portable 1 View  Result Date: 08/20/2020 CLINICAL DATA:  Status post intubation.  Field STEMI. EXAM: PORTABLE CHEST 1 VIEW COMPARISON:  Chest x-ray 03/05/2014 FINDINGS: Endotracheal tube with tip approximately 3 cm above the carina. Enteric tube coursing below the hemidiaphragm with tip collimated off view and side port overlying the gastric lumen. The heart size and mediastinal contours are unchanged. Aortic arch calcifications. No focal consolidation. Increased interstitial markings. Right costophrenic angle collimated off view. No pleural effusion. No pneumothorax. No acute osseous abnormality. IMPRESSION: Endotracheal tube and enteric tube in grossly appropriate position. Pulmonary edema. Electronically Signed   By: Tish Frederickson M.D.   On: 09/01/2020 21:14    ____________________________________________   PROCEDURES and INTERVENTIONS  Procedure(s) performed (including Critical Care):  .1-3 Lead EKG Interpretation Performed by: Delton Prairie, MD Authorized by: Delton Prairie, MD     Interpretation: abnormal     ECG rate:  140   ECG rate assessment: tachycardic     Rhythm: sinus tachycardia     Ectopy: none     Conduction: normal   .Critical Care Performed by: Delton Prairie, MD Authorized by: Delton Prairie, MD   Critical care provider statement:    Critical care time (minutes):  30   Critical care was time spent personally by me on the following activities:  Discussions  with consultants, evaluation of patient's response to treatment, examination of patient, ordering and performing treatments and interventions, ordering and review of laboratory studies, ordering and review of radiographic studies, pulse oximetry, re-evaluation of patient's condition, obtaining history from patient or surrogate and review of old charts Procedure Name: Intubation Date/Time: 09/07/2020 9:36 PM Performed by: Delton Prairie, MD Pre-anesthesia Checklist: Patient identified, Patient being monitored, Emergency Drugs available, Timeout performed and Suction available Oxygen Delivery Method: Non-rebreather mask Preoxygenation: Pre-oxygenation with 100% oxygen Induction Type: Rapid sequence Ventilation: Mask ventilation without difficulty Laryngoscope Size: Glidescope and 4 Grade View: Grade I Tube size: 7.5 mm Number of attempts: 1 Airway Equipment and Method: Rigid stylet Placement Confirmation: ETT inserted through vocal cords under direct vision,  CO2 detector and Breath sounds checked- equal and bilateral Secured at: 24 cm Tube secured with: ETT holder Dental Injury: Teeth and  Oropharynx as per pre-operative assessment  Comments: Edentulous and easy airway       Medications  fentaNYL 2500mcg in NS 250mL (3210mcg/ml) infusion-PREMIX (100 mcg/hr Intravenous New Bag/Given 09/11/2020 2102)  fentaNYL (SUBLIMAZE) injection 100 mcg (has no administration in time range)  propofol (DIPRIVAN) 1000 MG/100ML infusion (30 mcg/kg/min Intravenous New Bag/Given 09/07/2020 2102)  bivalirudin (ANGIOMAX) BOLUS via infusion (85.05 mg Intravenous Given 09/03/2020 2128)  Heparin (Porcine) in NaCl 1000-0.9 UT/500ML-% SOLN (500 mLs  Given 08/18/2020 2129)  lidocaine (PF) (XYLOCAINE) 1 % injection (10 mLs Infiltration Given 08/16/2020 2129)  bivalirudin (ANGIOMAX) 250 mg in sodium chloride 0.9 % 50 mL (5 mg/mL) infusion (1.75 mg/kg/hr  113.4 kg Intravenous New Bag/Given 09/02/2020 2132)  etomidate (AMIDATE) injection 20 mg  (20 mg Intravenous Given 09/13/2020 2042)  rocuronium [facility](ZEMURON) injection 100 mg (100 mg Intravenous Given 08/29/2020 2043)   ____________________________________________   MDM / ED COURSE   60 year old male presents to the ED after field arrest and field STEMI alert requiring immediate endotracheal intubation before urgently taken to the Cath Lab for diagnostic cath in the setting of anterior STEMI.  Intimately stable and persistently in sinus tachycardia.  Exam with excited delirium without obvious deficit or laterality.  He is not redirectable and to facilitate appropriate care he requires immediate sedation and endotracheal intubation.  This was uncomplicated.  EKG demonstrates anterior ischemic features concerning for STEMI, particularly in the setting of his prehospital arrest and defibrillation.  Discussed the case with cardiology, who agrees to take him to the Cath Lab.  Discussed the case with ICU who agrees to admit thereafter.  Discussed the case with patient's brother, who recommends frequent updates.  Patient stable throughout his stay in the ED and not requiring vasopressors or CPR  Clinical Course as of 2021-02-11 2138  Wed Aug 21, 2020  2049 I send EKG screenshot to Dr. Juliann Paresallwood and we discussed the case over the phone.  He is on the way to the hospital for emergent diagnostic cath.  Patient required intubation prior to EKG acquisition [DS]  2054 Repeat BP 165/90 without pressors.  We will add propofol for sedation [DS]  2055 Dr. Juliann Paresallwood in the ED. [DS]  2058 I discussed the case with ICU NP, Annabelle HarmanDana, who agrees to admit after Cath Lab [DS]  2106 Rolling to the cath lab [DS]  2109 I called brother, Vincent [name] [DS]  2111 I inform brother of STEMI, going to cath lab, and that he'll be admitted to the ICU after [DS]    Clinical Course User Index [DS] Delton PrairieSmith, Kennita Pavlovich, MD    ____________________________________________   FINAL CLINICAL IMPRESSION(S) / ED DIAGNOSES  Final diagnoses:   ST elevation myocardial infarction (STEMI) involving other coronary artery [facility](HCC)  Cardiac arrest [facility](HCC)  Delirium     ED Discharge Orders    None       Ziasia Lenoir   Note:  This document was prepared using Dragon voice recognition software and may include unintentional dictation errors.   Delton PrairieSmith, Alexsandria Kivett, MD 2021-02-11 [phone]

## 2020-08-21 NOTE — Consult Note (Signed)
Code STEMI Called the emergency room in a patient who had sudden death in the field who received defibrillation Initial EKG had suggestive ST elevation anteriorly Patient was intubated sedated Hemodynamically stable On propofol fentanyl Did not receive anticoagulation No other history was obtained only from EMS  Patient was brought to the cardiac Cath Lab And found to have anterior ST elevation Patient was found to have 100% occluded proximal LAD IRA Right coronary artery had moderate disease Circumflex had moderate disease Left ventricular function is severely depressed globally EF less than 25%  Intervention Status post PCI and stent DES overlapping 2 stents to the proximal to mid LAD 2.5x30 and a 2.25x30 resolute Onyx Patient is maintained on amiodarone Brilinta cantagrelor aspirin Angiomax propofol fentanyl Last peripheral blood pressure 147/104 heart rate of 80 patient intubated sedated on the vent Left groin access approach was sealed with a Mynx Case discussed with Annabelle Harman and critical care nurse practitioner They have accepted the patient for management post procedure Initial troponin greater than 2000 Patient is still critical

## 2020-08-21 NOTE — Consult Note (Signed)
Amiodarone Drug - Drug Interaction Consult Note  Recommendations:  No significant drug-drug interactions identified at this time. Will continue to monitor. Goal K > 4, Mg > 2  Amiodarone is metabolized by the cytochrome P450 system and therefore has the potential to cause many drug interactions. Amiodarone has an average plasma half-life of 50 days (range 20 to 100 days).   There is potential for drug interactions to occur several weeks or months after stopping treatment and the onset of drug interactions may be slow after initiating amiodarone.   []  Statins: Increased risk of myopathy. Simvastatin- restrict dose to 20mg  daily. Other statins: counsel patients to report any muscle pain or weakness immediately.  []  Anticoagulants: Amiodarone can increase anticoagulant effect. Consider warfarin dose reduction. Patients should be monitored closely and the dose of anticoagulant altered accordingly, remembering that amiodarone levels take several weeks to stabilize.  []  Antiepileptics: Amiodarone can increase plasma concentration of phenytoin, the dose should be reduced. Note that small changes in phenytoin dose can result in large changes in levels. Monitor patient and counsel on signs of toxicity.  []  Beta blockers: increased risk of bradycardia, AV block and myocardial depression. Sotalol - avoid concomitant use.  []   Calcium channel blockers (diltiazem and verapamil): increased risk of bradycardia, AV block and myocardial depression.  []   Cyclosporine: Amiodarone increases levels of cyclosporine. Reduced dose of cyclosporine is recommended.  []  Digoxin dose should be halved when amiodarone is started.  []  Diuretics: increased risk of cardiotoxicity if hypokalemia occurs.  []  Oral hypoglycemic agents (glyburide, glipizide, glimepiride): increased risk of hypoglycemia. Patient's glucose levels should be monitored closely when initiating amiodarone therapy.   []  Drugs that prolong the QT  interval:  Torsades de pointes risk may be increased with concurrent use - avoid if possible.  Monitor QTc, also keep magnesium/potassium WNL if concurrent therapy can't be avoided. Antibiotics: e.g. fluoroquinolones, erythromycin. . Antiarrhythmics: e.g. quinidine, procainamide, disopyramide, sotalol. . Antipsychotics: e.g. phenothiazines, haloperidol.  . Lithium, tricyclic antidepressants, and methadone.  Thank  09/14/2020 11:25 PM

## 2020-08-21 NOTE — ED Notes (Addendum)
Pt to cath lab.

## 2020-08-21 NOTE — ED Notes (Signed)
Cardiologist at bedside.  

## 2020-08-21 NOTE — ED Notes (Signed)
Pt belongings collected and placed into labeled belongings bag. Pt's belongings include: cut off orange t-shirt, cut off jeans and underwear, black belt, wallet, license, glasses, dentures, pair of socks, pair of sandals.

## 2020-08-21 NOTE — Consult Note (Addendum)
Remdesivir - Pharmacy Brief Note   O:  ALT: 59 CXR: "Pulmonary edema" SpO2: Patient is intubated   A/P:  Sep 16, 2020 SARS-CoV-2 PCR (+)  Remdesivir 200 mg IVPB once followed by 100 mg IVPB daily x 4 days.   Tressie Ellis 09/16/20 11:23 PM

## 2020-08-21 NOTE — ED Triage Notes (Signed)
Pt fell off a stool in the bar, pt went unresponsive, CPR initiated. EMS arrived, EKG showed STEMI. 5mg  versed on baord

## 2020-08-21 NOTE — H&P (Signed)
NAME:  Vincent [name], MRN:  [id number], DOB:  12-12-1960, LOS: 0 ADMISSION DATE:  Sep 07, 2020, CONSULTATION DATE: 09-07-2020 REFERRING MD: Dr. Katrinka Blazing, CHIEF COMPLAINT: Cardiac Arrest   History of Present Illness:  This is a 60 yo male who presented to [facility] ER via EMS post cardiac arrest on 04/6.  Per ER notes EMS reported the pt was seated at a local bar when he suddenly became unresponsive and fell off a barstool.  EMS and first responders notified and upon their arrival pt pulseless, CPR started and AED applied.  Pt required 1 shock, and ROSC achieved immediately. EMS inserted a King airway in the field.  Code STEMI initiated.  En route to the ER pt became agitated and delirious, therefore EMS administered versed.  However, despite versed the pt ripped his Brooke Dare airway out.  ED course Upon arrival to the ER pt remained altered and agitated with NRB in place and unable to redirect. Therefore, pt required mechanical intubation.  EKG suggestive of ST elevation anteriorly.  ER physician contacted on call Cardiologist Dr. Juliann Pares and pt transported to cath lab for emergent cardiac catheterization.  Cardiac cath revealed 100% occluded proximal LAD IRA requiring PCI and stent DES overlapping 2 stent to the proximal to mid LAD.  Post procedure pt admitted to ICU and PCCM team consulted to assist with management.      Pertinent  Medical History  Varicose Veins PVD Lymphedema HTN Heartburn DVT CAD COPD CKD Former ETOH Abuse   Significant Hospital Events: Including procedures, antibiotic start and stop dates in addition to other pertinent events   . Pt presented to [facility] ER on 04/6 with anterior STEMI requiring emergent mechanical intubation and cardiac cath  . Pt incidentally found to be COVID-19 positive . Remdesivir 04/6>> . Left IJ CVL 04/7>>  Interim History / Subjective:  Pt mechanically intubated   Objective   Blood pressure (!) 165/90, pulse (!) 145, resp. rate 17, height 5\' 8"  (1.727  m), weight 113.4 kg, SpO2 96 %.       No intake or output data in the 24 hours ending 09/07/20 2110 Filed Weights   2020-09-07 2057  Weight: 113.4 kg    Examination: General: acutely ill appearing male, NAD mechanically intubated  HENT: supple, mild JVD Lungs: rhonchi throughout, even, non labored  Cardiovascular: sinus rhythm with slight ST elevation, no R/G, 2+ radial/1+ distal pulses, trace bilateral lower extremity edema  Abdomen: +BS x4, obese, soft, non distended Extremities: normal bulk and tone Neuro: sedated, withdraws from painful stimulation, PERRL GU: foley in place draining yellow urine   Labs/imaging that I havepersonally reviewed  (right click and "Reselect all SmartList Selections" daily)  04/6: CO2 19/glucose 269/creatinine 1.63/calcium 8.6/anion gap 19/albumin 2.9/AST 73/ALT 59/troponin 2,918/wbc 17.7/hgb 12.5 04/6: CXR concerning for pulmonary edema  04/6: COVID-19 positive  04/6: Cardiac Cath revealed 100% occluded proximal LAD IRA. Right coronary artery had moderate disease. Circumflex had moderate disease. Left ventricular function is severely depressed globally EF less than 25%.  Status post PCI and stent DES overlapping 2 stents to the proximal to mid LAD 2.5x30 and a 2.25x30 resolute Onyx  Resolved Hospital Problem list   N/A  Assessment & Plan:   Acute hypoxic hypercapnic respiratory failure post cardiac arrest and  incidental finding of COVID-19 complicated by COPD  Mechanical intubation  Hx: Current everyday smoker Mechanical ventilation via ARDS protocol, target PRVC 6 cc/kg Wean PEEP and FiO2 as able to maintain O2 sats >88% Goal plateau pressure less  than 30, driving pressure less than 15 Paralytics if necessary for vent synchrony, gas exchange Cycle prone positioning if necessary for oxygenation Deep sedation per PAD protocol, goal RASS -4, currently fentanyl, propofol  Diuresis as blood pressure and renal function can tolerate, goal CVP 5-8.    VAP prevention order set Remdesivir, plan for 5 days Steroids Follow inflammatory markers: Ferritin, D-dimer, CRP, IL-6, LDH Assess for possible tocilizumab  Vitamin C, zinc Follow cultures  Trend WBC and monitor fever curve Trend PCT  NO INDICATION FOR HYPOTHERMIC PROTOCOL~pt with purposeful movement post cardiac arrest   Cardiac arrest  secondary to anterior STEMI due to 100% occluded proximal LAD IRA s/p PCI and placement of 2 stents  Acute CHF exacerbation~EF less than 25%  Hx: PVD, HTN, DVT, and CAD Continuous telemetry monitoring  Trend troponin's Cardiology consulted appreciate input~cardiac meds per recommendations   Acute on chronic renal failure  Trend BMP  Replace electrolytes as indicated  Monitor UOP Avoid nephrotoxic medications   Transaminitis likely secondary to cardiogenic shock  Trend hepatic function panel   Anemia without obvious acute blood loss Trend CBC  Monitor for s/sx of bleeding and transfuse for hgb <7  Acute encephalopath post cardiac arrest  Mechanical intubation pain/discomfort  Maintain RASS goal -1 to -2 for now Proporol gtt and prn fentanyl to maintain RASS goal  Will need CT Head once stable for transport  WUA daily    Best practice (right click and "Reselect all SmartList Selections" daily)  Diet:  NPO Pain/Anxiety/Delirium protocol (if indicated): Yes (RASS goal -3,-4) VAP protocol (if indicated): Yes DVT prophylaxis: Systemic AC GI prophylaxis: H2B Glucose control:  SSI Yes Central venous access:  N/A Arterial line:  N/A Foley:  Yes, and it is still needed Mobility:  bed rest  PT consulted: N/A Last date of multidisciplinary goals of care discussion [N/A] Code Status:  full code Disposition: ICU  Labs   CBC: Recent Labs  Lab 09/02/2020 2052  WBC 17.7*  NEUTROABS PENDING  HGB 12.5*  HCT 39.6  MCV 96.1  PLT 286    Basic Metabolic Panel: No results for input(s): NA, K, CL, CO2, GLUCOSE, BUN, CREATININE, CALCIUM,  MG, PHOS in the last 168 hours. GFR: CrCl cannot be calculated (Patient's most recent lab result is older than the maximum 21 days allowed.). Recent Labs  Lab 08/31/2020 2052  WBC 17.7*    Liver Function Tests: No results for input(s): AST, ALT, ALKPHOS, BILITOT, PROT, ALBUMIN in the last 168 hours. No results for input(s): LIPASE, AMYLASE in the last 168 hours. No results for input(s): AMMONIA in the last 168 hours.  ABG No results found for: PHART, PCO2ART, PO2ART, HCO3, TCO2, ACIDBASEDEF, O2SAT   Coagulation Profile: No results for input(s): INR, PROTIME in the last 168 hours.  Cardiac Enzymes: No results for input(s): CKTOTAL, CKMB, CKMBINDEX, TROPONINI in the last 168 hours.  HbA1C: Hemoglobin A1C  Date/Time Value Ref Range Status  03/05/2014 11:20 AM 6.2 4.2 - 6.3 % Final    Comment:    The American Diabetes Association recommends that a primary goal of therapy should be <7% and that physicians should reevaluate the treatment regimen in patients with HbA1c values consistently >8%.     CBG: No results for input(s): GLUCAP in the last 168 hours.  Review of Systems:   Unable to assess pt mechanically intubated   Past Medical History:  He,  has a past medical history of Alcoholic (HCC), Chronic kidney disease, COPD (chronic obstructive pulmonary  disease) (HCC), Coronary artery disease, DVT (deep venous thrombosis) (HCC), Heartburn, Hypertension, Lymphedema, Peripheral vascular disease (HCC), and Varicose veins.   Surgical History:   Past Surgical History:  Procedure Laterality Date  . HERNIA REPAIR    . LOWER EXTREMITY ANGIOGRAM Bilateral   . PERIPHERAL VASCULAR CATHETERIZATION Left 09/30/2015   Procedure: Lower Extremity Angiography;  Surgeon: Annice NeedyJason S Dew, MD;  Location: [facility] INVASIVE CV LAB;  Service: Cardiovascular;  Laterality: Left;  . PERIPHERAL VASCULAR CATHETERIZATION  09/30/2015   Procedure: Lower Extremity Intervention;  Surgeon: Annice NeedyJason S Dew, MD;  Location:  [facility] INVASIVE CV LAB;  Service: Cardiovascular;;  . PERIPHERAL VASCULAR CATHETERIZATION N/A 03/03/2016   Procedure: Lower Extremity Angiography;  Surgeon: Renford DillsGregory G Schnier, MD;  Location: [facility] INVASIVE CV LAB;  Service: Cardiovascular;  Laterality: N/A;  . PERIPHERAL VASCULAR CATHETERIZATION N/A 03/03/2016   Procedure: Abdominal Aortogram w/Lower Extremity;  Surgeon: Renford DillsGregory G Schnier, MD;  Location: [facility] INVASIVE CV LAB;  Service: Cardiovascular;  Laterality: N/A;  . PERIPHERAL VASCULAR CATHETERIZATION  03/03/2016   Procedure: Lower Extremity Intervention;  Surgeon: Renford DillsGregory G Schnier, MD;  Location: [facility] INVASIVE CV LAB;  Service: Cardiovascular;;     Social History:   reports that he has been smoking. He has a 49.00 pack-year smoking history. He has never used smokeless tobacco. He reports that he does not drink alcohol and does not use drugs.   Family History:  His family history is negative for Kidney disease and Prostate cancer.   Allergies No Known Allergies   Home Medications  Prior to Admission medications   Medication Sig Start Date End Date Taking? Authorizing Provider  albuterol (PROVENTIL HFA;VENTOLIN HFA) 108 (90 Base) MCG/ACT inhaler Inhale 2 puffs into the lungs every 6 (six) hours as needed for wheezing or shortness of breath.    [provider]  amLODipine (NORVASC) 2.5 MG tablet Take 2.5 mg by mouth daily.    [provider]  aspirin 81 MG tablet Take 81 mg by mouth daily.    [provider]  buPROPion (ZYBAN) 150 MG 12 hr tablet Take 150 mg by mouth 2 (two) times daily.    [provider]  cetirizine (ZYRTEC) 10 MG tablet Take 10 mg by mouth daily.    [provider]  clopidogrel (PLAVIX) 75 MG tablet Take 1 tablet (75 mg total) by mouth daily. 09/30/15   Annice Needyew, Jason S, MD  fluticasone (FLONASE) 50 MCG/ACT nasal spray Place 2 sprays into both nostrils daily as needed for allergies or rhinitis.    [provider]   hydrALAZINE (APRESOLINE) 10 MG tablet Take 40 mg by mouth daily.    [provider]  hydrochlorothiazide (HYDRODIURIL) 25 MG tablet Take 25 mg by mouth daily.    [provider]  ipratropium (ATROVENT HFA) 17 MCG/ACT inhaler Inhale 2 puffs into the lungs 3 (three) times daily.    [provider]  ipratropium-albuterol (DUONEB) 0.5-2.5 (3) MG/3ML SOLN Inhale into the lungs.    [provider]  lisinopril (PRINIVIL,ZESTRIL) 40 MG tablet  09/18/15   [provider]  omeprazole (PRILOSEC) 20 MG capsule Take 20 mg by mouth daily. Reported on 10/02/2015    [provider]  predniSONE (DELTASONE) 10 MG tablet  08/14/15   [provider]  sildenafil (VIAGRA) 50 MG tablet Take 50 mg by mouth as needed for erectile dysfunction. Reported on 10/02/2015    [provider]  tiotropium (SPIRIVA) 18 MCG inhalation capsule Place into inhaler and inhale.  [provider]     Critical care time: 1 hour    -Updated pts brother Maurilio Puryear via telephone regarding pts critical illness and current plan of care.  All questions were answered.  [name], AGNP  Pulmonary/Critical Care Pager [phone] (please enter 7 digits) PCCM Consult Pager [phone] (please enter 7 digits)

## 2020-08-21 NOTE — Consult Note (Signed)
MEDICATION RELATED CONSULT NOTE  Pharmacy Consult for Cangrelor Indication: PCI  No Known Allergies  Patient Measurements: Height: 5\' 8"  (172.7 cm) Weight: 113.4 kg (250 lb) IBW/kg (Calculated) : 68.4  Labs: Recent Labs    Aug 24, 2020 2052  WBC 17.7*  HGB 12.5*  HCT 39.6  PLT 286  APTT 35  CREATININE 1.63*  MG 2.4  ALBUMIN 2.9*  PROT 6.2*  AST 73*  ALT 59*  ALKPHOS 96  BILITOT 0.8   Estimated Creatinine Clearance: 58.2 mL/min (A) (by C-G formula based on SCr of 1.63 mg/dL (H)).   Medications:  --Cangrelor 30 mcg/kg bolus given at 2140 followed by drip at 4 mcg/kg/min --Ticagrelor 180 mg LD given at 2206 --Bivalirudin 0.75 mg/kg bolus given at 2128 followed by continuous infusion at 1.75 mg/kg/hr and then reduced to 0.25 mg/kg/hr  Assessment: Patient is a 60 y/o M who was BIBEMS on the evening of 4/6 as code STEMI and was emergently taken to cath lab with findings of completed occluded proximal LAD lesion which was treated with DES x 2.   Plan:  --Discussed with ICU RN --Hand-off from cath lab as follows  Continue cangrelor until midnight (DOT: 2 hours)  Continue bivalirudin until 2 am (DOT: 4 hours) --Follow-up ordering of maintenance dosing ticagrelor / ASA   6/6 Aug 24, 2020,11:52 PM

## 2020-08-22 ENCOUNTER — Inpatient Hospital Stay: Payer: Self-pay

## 2020-08-22 ENCOUNTER — Encounter: Payer: Self-pay | Admitting: Internal Medicine

## 2020-08-22 ENCOUNTER — Inpatient Hospital Stay
Admission: EM | Admit: 2020-08-22 | Discharge: 2020-08-22 | Disposition: A | Discharge status: Home / Self Care | Payer: Self-pay | Source: Home / Self Care | Attending: Internal Medicine | Admitting: Internal Medicine

## 2020-08-22 DIAGNOSIS — Z7189 Other specified counseling: Secondary | ICD-10-CM

## 2020-08-22 DIAGNOSIS — J9601 Acute respiratory failure with hypoxia: Secondary | ICD-10-CM

## 2020-08-22 DIAGNOSIS — I2102 ST elevation (STEMI) myocardial infarction involving left anterior descending coronary artery: Secondary | ICD-10-CM

## 2020-08-22 DIAGNOSIS — Z515 Encounter for palliative care: Secondary | ICD-10-CM

## 2020-08-22 DIAGNOSIS — R230 Cyanosis: Secondary | ICD-10-CM

## 2020-08-22 DIAGNOSIS — Z66 Do not resuscitate: Secondary | ICD-10-CM

## 2020-08-22 LAB — CBC WITH DIFFERENTIAL/PLATELET
Abs Immature Granulocytes: 0.21 10*3/uL — ABNORMAL HIGH (ref 0.00–0.07)
Basophils Absolute: 0 10*3/uL (ref 0.0–0.1)
Basophils Relative: 0 %
Eosinophils Absolute: 0 10*3/uL (ref 0.0–0.5)
Eosinophils Relative: 0 %
HCT: 39 % (ref 39.0–52.0)
Hemoglobin: 12.4 g/dL — ABNORMAL LOW (ref 13.0–17.0)
Immature Granulocytes: 1 %
Lymphocytes Relative: 2 %
Lymphs Abs: 0.4 10*3/uL — ABNORMAL LOW (ref 0.7–4.0)
MCH: 30 pg (ref 26.0–34.0)
MCHC: 31.8 g/dL (ref 30.0–36.0)
MCV: 94.2 fL (ref 80.0–100.0)
Monocytes Absolute: 0.9 10*3/uL (ref 0.1–1.0)
Monocytes Relative: 4 %
Neutro Abs: 19.2 10*3/uL — ABNORMAL HIGH (ref 1.7–7.7)
Neutrophils Relative %: 93 %
Platelets: 364 10*3/uL (ref 150–400)
RBC: 4.14 MIL/uL — ABNORMAL LOW (ref 4.22–5.81)
RDW: 13.1 % (ref 11.5–15.5)
WBC: 20.7 10*3/uL — ABNORMAL HIGH (ref 4.0–10.5)
nRBC: 0 % (ref 0.0–0.2)

## 2020-08-22 LAB — BLOOD GAS, ARTERIAL
Acid-base deficit: 1.3 mmol/L (ref 0.0–2.0)
Acid-base deficit: 4.4 mmol/L — ABNORMAL HIGH (ref 0.0–2.0)
Bicarbonate: 29.6 mmol/L — ABNORMAL HIGH (ref 20.0–28.0)
Bicarbonate: 25.7 mmol/L (ref 20.0–28.0)
FIO2: 1
FIO2: 100
MECHVT: 470 mL
MECHVT: 470 mL
O2 Saturation: 89.5 %
O2 Saturation: 97.5 %
PEEP: 14 cmH2O
PEEP: 14 cmH2O
Patient temperature: 37
Patient temperature: 37
RATE: 26 resp/min
RATE: 30 resp/min
pCO2 arterial: 85 mmHg (ref 32.0–48.0)
pCO2 arterial: 72 mmHg (ref 32.0–48.0)
pH, Arterial: 7.15 — CL (ref 7.350–7.450)
pH, Arterial: 7.16 — CL (ref 7.350–7.450)
pO2, Arterial: 74 mmHg — ABNORMAL LOW (ref 83.0–108.0)
pO2, Arterial: 119 mmHg — ABNORMAL HIGH (ref 83.0–108.0)

## 2020-08-22 LAB — MAGNESIUM: Magnesium: 2.3 mg/dL (ref 1.7–2.4)

## 2020-08-22 LAB — ECHOCARDIOGRAM COMPLETE
AR max vel: 3.79 cm2
AV Area VTI: 3.2 cm2
AV Area mean vel: 3.71 cm2
AV Mean grad: 2 mmHg
AV Peak grad: 2.7 mmHg
Ao pk vel: 0.82 m/s
Area-P 1/2: 3.53 cm2
Calc EF: 20.5 %
Height: 68 in
MV VTI: 1.74 cm2
Single Plane A2C EF: 27.4 %
Single Plane A4C EF: 13.9 %
Weight: 4000 oz

## 2020-08-22 LAB — CBC
HCT: 40.7 % (ref 39.0–52.0)
Hemoglobin: 12.9 g/dL — ABNORMAL LOW (ref 13.0–17.0)
MCH: 29.8 pg (ref 26.0–34.0)
MCHC: 31.7 g/dL (ref 30.0–36.0)
MCV: 94 fL (ref 80.0–100.0)
Platelets: 336 10*3/uL (ref 150–400)
RBC: 4.33 MIL/uL (ref 4.22–5.81)
RDW: 13.1 % (ref 11.5–15.5)
WBC: 18.2 10*3/uL — ABNORMAL HIGH (ref 4.0–10.5)
nRBC: 0 % (ref 0.0–0.2)

## 2020-08-22 LAB — GLUCOSE, CAPILLARY
Glucose-Capillary: 147 mg/dL — ABNORMAL HIGH (ref 70–99)
Glucose-Capillary: 157 mg/dL — ABNORMAL HIGH (ref 70–99)
Glucose-Capillary: 184 mg/dL — ABNORMAL HIGH (ref 70–99)
Glucose-Capillary: 193 mg/dL — ABNORMAL HIGH (ref 70–99)
Glucose-Capillary: 220 mg/dL — ABNORMAL HIGH (ref 70–99)

## 2020-08-22 LAB — HEMOGLOBIN A1C
Hgb A1c MFr Bld: 6.4 % — ABNORMAL HIGH (ref 4.8–5.6)
Mean Plasma Glucose: 136.98 mg/dL

## 2020-08-22 LAB — C-REACTIVE PROTEIN
CRP: 13.8 mg/dL — ABNORMAL HIGH (ref <1.0)
CRP: 13 mg/dL — ABNORMAL HIGH (ref <1.0)

## 2020-08-22 LAB — COMPREHENSIVE METABOLIC PANEL
ALT: 288 U/L — ABNORMAL HIGH (ref 0–44)
AST: 256 U/L — ABNORMAL HIGH (ref 15–41)
Albumin: 2.6 g/dL — ABNORMAL LOW (ref 3.5–5.0)
Alkaline Phosphatase: 98 U/L (ref 38–126)
Anion gap: 9 (ref 5–15)
BUN: 22 mg/dL (ref 8–23)
CO2: 27 mmol/L (ref 22–32)
Calcium: 7.4 mg/dL — ABNORMAL LOW (ref 8.9–10.3)
Chloride: 102 mmol/L (ref 98–111)
Creatinine, Ser: 2.2 mg/dL — ABNORMAL HIGH (ref 0.61–1.24)
GFR, Estimated: 33 mL/min — ABNORMAL LOW (ref >60)
Glucose, Bld: 248 mg/dL — ABNORMAL HIGH (ref 70–99)
Potassium: 5.3 mmol/L — ABNORMAL HIGH (ref 3.5–5.1)
Sodium: 138 mmol/L (ref 135–145)
Total Bilirubin: 0.8 mg/dL (ref 0.3–1.2)
Total Protein: 5.9 g/dL — ABNORMAL LOW (ref 6.5–8.1)

## 2020-08-22 LAB — PHOSPHORUS
Phosphorus: 7.4 mg/dL — ABNORMAL HIGH (ref 2.5–4.6)
Phosphorus: 7.4 mg/dL — ABNORMAL HIGH (ref 2.5–4.6)

## 2020-08-22 LAB — BASIC METABOLIC PANEL
Anion gap: 11 (ref 5–15)
BUN: 25 mg/dL — ABNORMAL HIGH (ref 8–23)
CO2: 26 mmol/L (ref 22–32)
Calcium: 7.7 mg/dL — ABNORMAL LOW (ref 8.9–10.3)
Chloride: 103 mmol/L (ref 98–111)
Creatinine, Ser: 2.53 mg/dL — ABNORMAL HIGH (ref 0.61–1.24)
GFR, Estimated: 28 mL/min — ABNORMAL LOW (ref >60)
Glucose, Bld: 231 mg/dL — ABNORMAL HIGH (ref 70–99)
Potassium: 4.6 mmol/L (ref 3.5–5.1)
Sodium: 140 mmol/L (ref 135–145)

## 2020-08-22 LAB — FERRITIN
Ferritin: 1151 ng/mL — ABNORMAL HIGH (ref 24–336)
Ferritin: 3414 ng/mL — ABNORMAL HIGH (ref 24–336)

## 2020-08-22 LAB — HIV ANTIBODY (ROUTINE TESTING W REFLEX): HIV Screen 4th Generation wRfx: NONREACTIVE

## 2020-08-22 LAB — TROPONIN I (HIGH SENSITIVITY)
Troponin I (High Sensitivity): 4109 ng/L (ref <18)
Troponin I (High Sensitivity): 4276 ng/L (ref <18)

## 2020-08-22 LAB — D-DIMER, QUANTITATIVE: D-Dimer, Quant: >20 ug/mL-FEU — ABNORMAL HIGH (ref 0.00–0.50)

## 2020-08-22 MED ORDER — TICAGRELOR 90 MG PO TABS: 90.0000 mg | ORAL_TABLET | Freq: Two times a day (BID) | ORAL | Status: DC

## 2020-08-22 MED ORDER — PANTOPRAZOLE SODIUM 40 MG IV SOLR
40.0000 mg | INTRAVENOUS | Status: DC
  Administered 2020-08-22 – 2020-08-25 (×4): 40 mg via INTRAVENOUS
  Filled 2020-08-22 (×4): qty 40

## 2020-08-22 MED ORDER — ASPIRIN 81 MG PO CHEW: 81.0000 mg | CHEWABLE_TABLET | Freq: Every day | ORAL | Status: DC

## 2020-08-22 MED ORDER — ASPIRIN 81 MG PO CHEW
81.0000 mg | CHEWABLE_TABLET | Freq: Every day | ORAL | Status: DC
  Filled 2020-08-22: qty 1

## 2020-08-22 MED ORDER — ORAL CARE MOUTH RINSE
15.0000 mL | OROMUCOSAL | Status: DC
  Administered 2020-08-22 – 2020-08-26 (×38): 15 mL via OROMUCOSAL

## 2020-08-22 MED ORDER — TICAGRELOR 90 MG PO TABS
90.0000 mg | ORAL_TABLET | Freq: Two times a day (BID) | ORAL | Status: DC
  Filled 2020-08-22: qty 1

## 2020-08-22 MED ORDER — SODIUM CHLORIDE 0.9% FLUSH: 3.0000 mL | INTRAVENOUS | Status: DC | PRN

## 2020-08-22 MED ORDER — NOREPINEPHRINE 16 MG/250ML-% IV SOLN
0.0000 ug/min | INTRAVENOUS | Status: DC
  Administered 2020-08-22: 10 ug/min via INTRAVENOUS
  Administered 2020-08-22: 20 ug/min via INTRAVENOUS
  Filled 2020-08-22 (×3): qty 250

## 2020-08-22 MED ORDER — MIDAZOLAM 50MG/50ML (1MG/ML) PREMIX INFUSION
1.0000 mg/h | INTRAVENOUS | Status: DC
  Administered 2020-08-22: 4 mg/h via INTRAVENOUS
  Administered 2020-08-22: 2 mg/h via INTRAVENOUS
  Administered 2020-08-23: 3 mg/h via INTRAVENOUS
  Administered 2020-08-23 – 2020-08-24 (×2): 4 mg/h via INTRAVENOUS
  Administered 2020-08-25: 2 mg/h via INTRAVENOUS
  Filled 2020-08-22 (×6): qty 50

## 2020-08-22 MED ORDER — SODIUM CHLORIDE 0.9% FLUSH: 10.0000 mL | INTRAVENOUS | Status: DC | PRN

## 2020-08-22 MED ORDER — PROSOURCE TF PO LIQD
45.0000 mL | Freq: Four times a day (QID) | ORAL | Status: DC
  Administered 2020-08-23 – 2020-08-25 (×12): 45 mL
  Filled 2020-08-22: qty 45

## 2020-08-22 MED ORDER — CHLORHEXIDINE GLUCONATE CLOTH 2 % EX PADS
6.0000 | MEDICATED_PAD | Freq: Every day | CUTANEOUS | Status: DC
  Administered 2020-08-22 – 2020-08-25 (×4): 6 via TOPICAL

## 2020-08-22 MED ORDER — INSULIN ASPART 100 UNIT/ML IV SOLN
10.0000 [IU] | Freq: Once | INTRAVENOUS | Status: AC
  Administered 2020-08-22: 10 [IU] via INTRAVENOUS
  Filled 2020-08-22: qty 0.1

## 2020-08-22 MED ORDER — HYDROCORTISONE NA SUCCINATE PF 100 MG IJ SOLR
50.0000 mg | Freq: Four times a day (QID) | INTRAMUSCULAR | Status: DC
  Administered 2020-08-22 – 2020-08-26 (×17): 50 mg via INTRAVENOUS
  Filled 2020-08-22 (×17): qty 2

## 2020-08-22 MED ORDER — LABETALOL HCL 5 MG/ML IV SOLN: 10.0000 mg | INTRAVENOUS | Status: AC | PRN

## 2020-08-22 MED ORDER — VITAL AF 1.2 CAL PO LIQD
1000.0000 mL | ORAL | Status: DC
  Administered 2020-08-22 – 2020-08-23 (×2): 1000 mL

## 2020-08-22 MED ORDER — TICAGRELOR 90 MG PO TABS
90.0000 mg | ORAL_TABLET | Freq: Two times a day (BID) | ORAL | Status: DC
  Administered 2020-08-22 – 2020-08-25 (×8): 90 mg
  Filled 2020-08-22 (×7): qty 1

## 2020-08-22 MED ORDER — CHLORHEXIDINE GLUCONATE 0.12% ORAL RINSE (MEDLINE KIT)
15.0000 mL | Freq: Two times a day (BID) | OROMUCOSAL | Status: DC
  Administered 2020-08-22 – 2020-08-25 (×8): 15 mL via OROMUCOSAL

## 2020-08-22 MED ORDER — DEXTROSE 50 % IV SOLN
12.5000 g | Freq: Once | INTRAVENOUS | Status: AC
  Administered 2020-08-22: 12.5 g via INTRAVENOUS
  Filled 2020-08-22: qty 50

## 2020-08-22 MED ORDER — ZINC SULFATE 220 (50 ZN) MG PO CAPS
220.0000 mg | ORAL_CAPSULE | Freq: Every day | ORAL | Status: DC
  Administered 2020-08-23 – 2020-08-25 (×3): 220 mg
  Filled 2020-08-22 (×3): qty 1

## 2020-08-22 MED ORDER — SODIUM CHLORIDE 0.9 % IV SOLN: 250.0000 mL | INTRAVENOUS | Status: DC | PRN

## 2020-08-22 MED ORDER — SODIUM CHLORIDE 0.9% FLUSH: 3.0000 mL | Freq: Two times a day (BID) | INTRAVENOUS | Status: DC

## 2020-08-22 MED ORDER — VASOPRESSIN 20 UNITS/100 ML INFUSION FOR SHOCK
0.0000 [IU]/min | INTRAVENOUS | Status: DC
  Administered 2020-08-22 – 2020-08-23 (×4): 0.03 [IU]/min via INTRAVENOUS
  Filled 2020-08-22 (×4): qty 100

## 2020-08-22 MED ORDER — MIDODRINE HCL 5 MG PO TABS
5.0000 mg | ORAL_TABLET | Freq: Three times a day (TID) | ORAL | Status: DC
  Administered 2020-08-22 – 2020-08-25 (×11): 5 mg
  Filled 2020-08-22 (×12): qty 1

## 2020-08-22 MED ORDER — ASCORBIC ACID 500 MG PO TABS
500.0000 mg | ORAL_TABLET | Freq: Every day | ORAL | Status: DC
  Administered 2020-08-23 – 2020-08-25 (×3): 500 mg
  Filled 2020-08-22 (×3): qty 1

## 2020-08-22 MED ORDER — SODIUM CHLORIDE 0.9% FLUSH
10.0000 mL | Freq: Two times a day (BID) | INTRAVENOUS | Status: DC
  Administered 2020-08-23 – 2020-08-24 (×5): 10 mL
  Administered 2020-08-25: 30 mL
  Administered 2020-08-25: 40 mL

## 2020-08-22 MED ORDER — PERFLUTREN LIPID MICROSPHERE
1.0000 mL | INTRAVENOUS | Status: AC | PRN
Start: 2020-08-22 — End: 2020-08-22
  Administered 2020-08-22: 3 mL via INTRAVENOUS
  Filled 2020-08-22: qty 10

## 2020-08-22 MED ORDER — IPRATROPIUM BROMIDE HFA 17 MCG/ACT IN AERS
2.0000 | INHALATION_SPRAY | Freq: Three times a day (TID) | RESPIRATORY_TRACT | Status: DC
  Filled 2020-08-22: qty 12.9

## 2020-08-22 MED ORDER — ASPIRIN 81 MG PO CHEW
81.0000 mg | CHEWABLE_TABLET | Freq: Every day | ORAL | Status: DC
  Administered 2020-08-22 – 2020-08-25 (×4): 81 mg
  Filled 2020-08-22 (×3): qty 1

## 2020-08-22 MED ORDER — FREE WATER
30.0000 mL | Status: DC
  Administered 2020-08-22 – 2020-08-26 (×21): 30 mL

## 2020-08-22 MED ORDER — SODIUM ZIRCONIUM CYCLOSILICATE 5 G PO PACK: 10.0000 g | PACK | Freq: Once | ORAL | Status: DC

## 2020-08-22 MED ORDER — ALBUTEROL SULFATE HFA 108 (90 BASE) MCG/ACT IN AERS
2.0000 | INHALATION_SPRAY | Freq: Four times a day (QID) | RESPIRATORY_TRACT | Status: DC | PRN
  Filled 2020-08-22: qty 6.7

## 2020-08-22 MED ORDER — ASPIRIN EC 81 MG PO TBEC: 81.0000 mg | DELAYED_RELEASE_TABLET | Freq: Every day | ORAL | Status: DC

## 2020-08-22 MED ORDER — INSULIN ASPART 100 UNIT/ML ~~LOC~~ SOLN
0.0000 [IU] | SUBCUTANEOUS | Status: DC
  Administered 2020-08-22: 2 [IU] via SUBCUTANEOUS
  Administered 2020-08-22: 1 [IU] via SUBCUTANEOUS
  Administered 2020-08-22: 3 [IU] via SUBCUTANEOUS
  Administered 2020-08-22 – 2020-08-23 (×2): 2 [IU] via SUBCUTANEOUS
  Administered 2020-08-23: 1 [IU] via SUBCUTANEOUS
  Administered 2020-08-23: 3 [IU] via SUBCUTANEOUS
  Administered 2020-08-23: 2 [IU] via SUBCUTANEOUS
  Administered 2020-08-23: 1 [IU] via SUBCUTANEOUS
  Administered 2020-08-23: 2 [IU] via SUBCUTANEOUS
  Administered 2020-08-23: 3 [IU] via SUBCUTANEOUS
  Administered 2020-08-24 (×4): 2 [IU] via SUBCUTANEOUS
  Administered 2020-08-24: 3 [IU] via SUBCUTANEOUS
  Administered 2020-08-25 (×4): 2 [IU] via SUBCUTANEOUS
  Administered 2020-08-25: 1 [IU] via SUBCUTANEOUS
  Administered 2020-08-25 – 2020-08-26 (×2): 2 [IU] via SUBCUTANEOUS
  Administered 2020-08-26: 1 [IU] via SUBCUTANEOUS
  Filled 2020-08-22 (×22): qty 1

## 2020-08-22 MED ORDER — ATORVASTATIN CALCIUM 20 MG PO TABS
80.0000 mg | ORAL_TABLET | Freq: Every day | ORAL | Status: DC
  Filled 2020-08-22: qty 4

## 2020-08-22 MED ORDER — ACETAMINOPHEN 325 MG PO TABS
650.0000 mg | ORAL_TABLET | ORAL | Status: DC | PRN
  Administered 2020-08-23 (×2): 650 mg via ORAL
  Filled 2020-08-22 (×2): qty 2

## 2020-08-22 MED ORDER — HYDRALAZINE HCL 20 MG/ML IJ SOLN: 10.0000 mg | INTRAMUSCULAR | Status: AC | PRN

## 2020-08-22 MED ORDER — HEPARIN SODIUM (PORCINE) 5000 UNIT/ML IJ SOLN
5000.0000 [IU] | Freq: Three times a day (TID) | INTRAMUSCULAR | Status: DC
  Administered 2020-08-22 – 2020-08-23 (×6): 5000 [IU] via SUBCUTANEOUS
  Filled 2020-08-22 (×6): qty 1

## 2020-08-22 MED ORDER — ONDANSETRON HCL 4 MG/2ML IJ SOLN: 4.0000 mg | Freq: Four times a day (QID) | INTRAMUSCULAR | Status: DC | PRN

## 2020-08-22 NOTE — Consult Note (Signed)
CARDIOLOGY CONSULT NOTE               Patient ID: Vincent [name] MRN: [id number] DOB/AGE: Sep 08, 1960 60 y.o.  Admit date: 09/12/2020 Referring Physician Dr. Katrinka Blazing emergency room Primary Physician unknown Primary Cardiologist unknown Reason for Consultation STEMI post cardiac arrest  HPI: Patient is a 60 year old male reportedly was at a bar and had sudden death cardiac arrest fell off the barstool and was in full arrest had CPR on scene by bystanders EMS found he was in V. fib pulseless and initiated in the ED and he was delivered 1 shock and he had ROSC patient had a King airway placed brought to the emergency room subsequently intubated sedated code STEMI was initiated with abnormal EKG.  Patient was then brought to the cardiac Cath Lab for further evaluation patient did not receive any aspirin or heparin since was intubated patient was on propofol and fentanyl drips.  Had not received any amiodarone after a cardiac arrest during defibrillation but appeared to be hemodynamically stable  Review of systems complete and found to be negative unless listed above     Past Medical History:  Diagnosis Date  . Alcoholic (HCC)   . Chronic kidney disease   . COPD (chronic obstructive pulmonary disease) (HCC)   . Coronary artery disease   . DVT (deep venous thrombosis) (HCC)   . Heartburn   . Hypertension   . Lymphedema   . Peripheral vascular disease (HCC)   . Varicose veins     Past Surgical History:  Procedure Laterality Date  . HERNIA REPAIR    . LOWER EXTREMITY ANGIOGRAM Bilateral   . PERIPHERAL VASCULAR CATHETERIZATION Left 09/30/2015   Procedure: Lower Extremity Angiography;  Surgeon: Annice Needy, MD;  Location: [facility] INVASIVE CV LAB;  Service: Cardiovascular;  Laterality: Left;  . PERIPHERAL VASCULAR CATHETERIZATION  09/30/2015   Procedure: Lower Extremity Intervention;  Surgeon: Annice Needy, MD;  Location: [facility] INVASIVE CV LAB;  Service: Cardiovascular;;  . PERIPHERAL  VASCULAR CATHETERIZATION N/A 03/03/2016   Procedure: Lower Extremity Angiography;  Surgeon: Renford Dills, MD;  Location: [facility] INVASIVE CV LAB;  Service: Cardiovascular;  Laterality: N/A;  . PERIPHERAL VASCULAR CATHETERIZATION N/A 03/03/2016   Procedure: Abdominal Aortogram w/Lower Extremity;  Surgeon: Renford Dills, MD;  Location: [facility] INVASIVE CV LAB;  Service: Cardiovascular;  Laterality: N/A;  . PERIPHERAL VASCULAR CATHETERIZATION  03/03/2016   Procedure: Lower Extremity Intervention;  Surgeon: Renford Dills, MD;  Location: [facility] INVASIVE CV [facility];  Service: Cardiovascular;;    Medications Prior to Admission  Medication Sig Dispense Refill Last Dose  . albuterol (PROVENTIL HFA;VENTOLIN HFA) 108 (90 Base) MCG/ACT inhaler Inhale 2 puffs into the lungs every 6 (six) hours as needed for wheezing or shortness of breath.     Marland Kitchen amLODipine (NORVASC) 2.5 MG tablet Take 2.5 mg by mouth daily.     Marland Kitchen aspirin 81 MG tablet Take 81 mg by mouth daily.     Marland Kitchen buPROPion (ZYBAN) 150 MG 12 hr tablet Take 150 mg by mouth 2 (two) times daily.     . cetirizine (ZYRTEC) 10 MG tablet Take 10 mg by mouth daily.     . clopidogrel (PLAVIX) 75 MG tablet Take 1 tablet (75 mg total) by mouth daily. 30 tablet 11   . fluticasone (FLONASE) 50 MCG/ACT nasal spray Place 2 sprays into both nostrils daily as needed for allergies or rhinitis.     . hydrALAZINE (APRESOLINE) 10 MG tablet Take 40 mg  by mouth daily.     . hydrochlorothiazide (HYDRODIURIL) 25 MG tablet Take 25 mg by mouth daily.     Marland Kitchen ipratropium (ATROVENT HFA) 17 MCG/ACT inhaler Inhale 2 puffs into the lungs 3 (three) times daily.     Marland Kitchen ipratropium-albuterol (DUONEB) 0.5-2.5 (3) MG/3ML SOLN Inhale into the lungs.     Marland Kitchen lisinopril (PRINIVIL,ZESTRIL) 40 MG tablet   4   . omeprazole (PRILOSEC) 20 MG capsule Take 20 mg by mouth daily. Reported on 10/02/2015     . predniSONE (DELTASONE) 10 MG tablet   0   . sildenafil (VIAGRA) 50 MG tablet Take 50 mg by mouth as  needed for erectile dysfunction. Reported on 10/02/2015     . tiotropium (SPIRIVA) 18 MCG inhalation capsule Place into inhaler and inhale.      Social History   Socioeconomic History  . Marital status: Married    Spouse name: Not on file  . Number of children: Not on file  . Years of education: Not on file  . Highest education level: Not on file  Occupational History  . Not on file  Tobacco Use  . Smoking status: Current Every Day Smoker    Packs/day: 1.00    Years: 49.00    Pack years: 49.00  . Smokeless tobacco: Never Used  Substance and Sexual Activity  . Alcohol use: No  . Drug use: No  . Sexual activity: Not on file  Other Topics Concern  . Not on file  Social History Narrative  . Not on file   Social Determinants of Health   Financial Resource Strain: Not on file  Food Insecurity: Not on file  Transportation Needs: Not on file  Physical Activity: Not on file  Stress: Not on file  Social Connections: Not on file  Intimate Partner Violence: Not on file    Family History  Problem Relation Age of Onset  . Kidney disease Neg Hx   . Prostate cancer Neg Hx       Review of systems complete and found to be negative unless listed above      PHYSICAL EXAM  General: Well developed, well nourished, respiratory distress on the vent HEENT:  Normocephalic and atramatic Neck:  No JVD.  Lungs: Clear bilaterally to auscultation and percussion. Heart: HRRR . Normal S1 and S2 without gallops or murmurs.  Abdomen: Bowel sounds are positive, abdomen soft and non-tender  Msk:  Back normal, normal gait. Normal strength and tone for age. Extremities: No clubbing, cyanosis or edema.   Neuro: Intubated sedated. Psych:  Good affect, responds appropriately  Labs:   Lab Results  Component Value Date   WBC 20.7 (H) 08/22/2020   HGB 12.4 (L) 08/22/2020   HCT 39.0 08/22/2020   MCV 94.2 08/22/2020   PLT 364 08/22/2020    Recent Labs  Lab 08/22/20 0500  NA 138  K 5.3*   CL 102  CO2 27  BUN 22  CREATININE 2.20*  CALCIUM 7.4*  PROT 5.9*  BILITOT 0.8  ALKPHOS 98  ALT 288*  AST 256*  GLUCOSE 248*   Lab Results  Component Value Date   TROPONINI < 0.02 03/01/2014   No results found for: CHOL No results found for: HDL No results found for: LDLCALC No results found for: TRIG No results found for: CHOLHDL No results found for: LDLDIRECT    Radiology: CT HEAD WO CONTRAST  Result Date: 08/22/2020 CLINICAL DATA:  60 year old male status post cardiac arrest, STEMI. EXAM: CT HEAD  WITHOUT CONTRAST TECHNIQUE: Contiguous axial images were obtained from the base of the skull through the vertex without intravenous contrast. COMPARISON:  Brain MRI 08/19/2016. FINDINGS: Brain: No midline shift, mass effect, or evidence of intracranial mass lesion. No ventriculomegaly. No acute intracranial hemorrhage identified. No cortically based acute infarct identified. Gray-white matter differentiation appears symmetric and within normal limits throughout the brain. Normal basilar cisterns. Vascular: Extensive Calcified atherosclerosis at the skull base. Intravascular contrast appears to be present. No suspicious intracranial vascular hyperdensity. Skull: No acute osseous abnormality identified. Sinuses/Orbits: Scattered sinus fluid, mucosal thickening and opacification. Fluid in the nasal cavity and nasopharynx. Intubated. Tympanic cavities and mastoids remain well aerated. Other: Generalized scalp soft tissue edema. Right anterior vertex broad-based scalp hematoma. Incidental small benign left vertex scalp lipoma. IMPRESSION: 1. Intravascular contrast suspected. Normal CT appearance of the brain. 2. Generalized scalp soft tissue edema and right vertex scalp hematoma. 3. Intubated.  Fluid in the pharynx, paranasal sinuses. Electronically Signed   By: Odessa Fleming M.D.   On: 08/22/2020 06:19   DG Chest Port 1 View  Result Date: 08/22/2020 CLINICAL DATA:  Central line placement EXAM: PORTABLE  CHEST 1 VIEW COMPARISON:  08-24-2020 FINDINGS: Left central line is in place with the tip in the upper SVC. No pneumothorax. Endotracheal tube and NG tube are unchanged. Diffuse bilateral airspace disease, worsening on the left since prior study. IMPRESSION: Left central line tip in the upper SVC.  No pneumothorax. Diffuse bilateral airspace disease, worsening on the left since prior study Electronically Signed   By: Charlett Nose M.D.   On: 08/22/2020 01:03   DG Chest Portable 1 View  Result Date: 08/24/20 CLINICAL DATA:  Status post intubation.  Field STEMI. EXAM: PORTABLE CHEST 1 VIEW COMPARISON:  Chest x-ray 03/05/2014 FINDINGS: Endotracheal tube with tip approximately 3 cm above the carina. Enteric tube coursing below the hemidiaphragm with tip collimated off view and side port overlying the gastric lumen. The heart size and mediastinal contours are unchanged. Aortic arch calcifications. No focal consolidation. Increased interstitial markings. Right costophrenic angle collimated off view. No pleural effusion. No pneumothorax. No acute osseous abnormality. IMPRESSION: Endotracheal tube and enteric tube in grossly appropriate position. Pulmonary edema. Electronically Signed   By: Tish Frederickson M.D.   On: 2020/08/24 21:14    EKG: Sinus tachycardia ST elevation anteriorly nonspecific T changes otherwise rate of about 100  ASSESSMENT AND PLAN:  STEMI anterior wall Sudden death Respiratory failure Abnormal EKG Covid positive Peripheral vascular disease Hypertension Acute on chronic renal sufficiency History of alcohol abuse GERD Coronary disease . Plan Agree with admit to ICU for sudden death Evidence of STEMI presentation Continue vent support intubated sedated Recommend anticoagulation with heparin Echocardiogram for assessment of left ventricular function NG tube for delivery of medication Recommend emergent cardiac cath with intention to treat Continue critical care management and  care Consider neurology evaluation post arrest  Signed: Alwyn Pea MD 08/22/2020, 7:13 AM

## 2020-08-22 NOTE — Progress Notes (Signed)
[facility]   This is a 60 yo male who presented to [facility] ER via EMS post cardiac arrest on 04/6.  Per ER notes EMS reported the pt was seated at a local bar when he suddenly became unresponsive and fell off a barstool.  EMS and first responders notified and upon their arrival pt pulseless, CPR started and AED applied.  Pt required 1 shock, and ROSC achieved immediately. EMS inserted a King airway in the field.  Code STEMI initiated.  En route to the ER pt became agitated and delirious, therefore EMS administered versed.   Upon arrival to the ER pt remained altered and agitated with NRB in place and unable to redirect. Therefore, pt required mechanical intubation.  EKG suggestive of ST elevation anteriorly.  ER physician contacted on call Cardiologist Dr. Juliann Pares and pt transported to cath lab for emergent cardiac catheterization.  Cardiac cath revealed 100% occluded proximal LAD IRA requiring PCI and stent DES overlapping 2 stent to the proximal to mid LAD.   Consult received for mottling / cyanosis noted to the right lower extremity on today's exam. Patient is currently Covid positive Patient is currently intubated/sedated Patient is also currently still on pressors  Would not be able to intervene until the patient is stable. Unfortunately, he is a very high risk for limb loss and need for amputation. Full consult to follow  Discussed in detail with Dr. Wall[facility] PA-C 08/22/2020 12:34 PM

## 2020-08-22 NOTE — Consult Note (Signed)
Amiodarone Drug - Drug Interaction Consult Note  Recommendations:  No significant drug-drug interactions identified at this time. Will continue to monitor. Goal K > 4, Mg > 2  Amiodarone is metabolized by the cytochrome P450 system and therefore has the potential to cause many drug interactions. Amiodarone has an average plasma half-life of 50 days (range 20 to 100 days).   There is potential for drug interactions to occur several weeks or months after stopping treatment and the onset of drug interactions may be slow after initiating amiodarone.   []  Statins: Increased risk of myopathy. Simvastatin- restrict dose to 20mg  daily. Other statins: counsel patients to report any muscle pain or weakness immediately.  []  Anticoagulants: Amiodarone can increase anticoagulant effect. Consider warfarin dose reduction. Patients should be monitored closely and the dose of anticoagulant altered accordingly, remembering that amiodarone levels take several weeks to stabilize.  []  Antiepileptics: Amiodarone can increase plasma concentration of phenytoin, the dose should be reduced. Note that small changes in phenytoin dose can result in large changes in levels. Monitor patient and counsel on signs of toxicity.  []  Beta blockers: increased risk of bradycardia, AV block and myocardial depression. Sotalol - avoid concomitant use.  []   Calcium channel blockers (diltiazem and verapamil): increased risk of bradycardia, AV block and myocardial depression.  []   Cyclosporine: Amiodarone increases levels of cyclosporine. Reduced dose of cyclosporine is recommended.  []  Digoxin dose should be halved when amiodarone is started.  []  Diuretics: increased risk of cardiotoxicity if hypokalemia occurs.  []  Oral hypoglycemic agents (glyburide, glipizide, glimepiride): increased risk of hypoglycemia. Patient's glucose levels should be monitored closely when initiating amiodarone therapy.   []  Drugs that prolong the QT  interval:  Torsades de pointes risk may be increased with concurrent use - avoid if possible.  Monitor QTc, also keep magnesium/potassium WNL if concurrent therapy can't be avoided. Antibiotics: e.g. fluoroquinolones, erythromycin. . Antiarrhythmics: e.g. quinidine, procainamide, disopyramide, sotalol. . Antipsychotics: e.g. phenothiazines, haloperidol.  . Lithium, tricyclic antidepressants, and methadone.  Thank You,   , PharmD Pharmacy Resident  08/22/2020 3:21 PM

## 2020-08-22 NOTE — Progress Notes (Signed)
*  PRELIMINARY RESULTS* Echocardiogram 2D Echocardiogram has been performed.  Vincent [name] Vincent [name] 08/22/2020, 1:10 PM

## 2020-08-22 NOTE — Progress Notes (Signed)
Pt with severe mottling of right lower extremity and extremity cool to touch.  Able to doppler 1+ popliteal pulse.  Pt currently requiring levophed and vasopressin gtts due to hypotension.  Will consult vascular surgery and orders placed to obtain US Arterial Lower Extremity Duplex Right.  Will attempt to wean vasopressors: changed iv steroids to solu-cortef and started midodrine per tube.  Will continue to monitor and assess pt.  [name], AGNP  Pulmonary/Critical Care Pager [phone] (please enter 7 digits) PCCM Consult Pager [phone] (please enter 7 digits)

## 2020-08-22 NOTE — Progress Notes (Signed)
PHARMACY CONSULT NOTE - FOLLOW UP  Pharmacy Consult for Electrolyte Monitoring and Replacement   Recent Labs: Potassium (mmol/L)  Date Value  08/22/2020 4.6  03/03/2014 4.1   Magnesium (mg/dL)  Date Value  63/84/6659 2.3   Calcium (mg/dL)  Date Value  93/57/0177 7.7 (L)   Calcium, Total (mg/dL)  Date Value  93/90/3009 8.0 (L)   Albumin (g/dL)  Date Value  23/30/0762 2.6 (L)   Phosphorus (mg/dL)  Date Value  26/33/3545 7.4 (H)   Sodium (mmol/L)  Date Value  08/22/2020 140  03/03/2014 138   Corrected Ca: 8.5  Assessment: Patient is a 60 y/o M who was BIBEMS on the evening of 4/6 as code STEMI and was emergently taken to cath lab with findings of completed occluded proximal LAD lesion which was treated with DES x 2. Pharmacy has been consulted to monitor and replace electrolytes.   Goal of Therapy:  K 4.0-5.1 Mg 2.0-2.4 All other electrolytes WNL  Plan:  --No electrolyte replacement needed at this time  --Monitor electrolytes with AM labs   Reatha Armour, PharmD Pharmacy Resident  08/22/2020 3:26 PM

## 2020-08-22 NOTE — Progress Notes (Signed)
GOALS OF CARE DISCUSSION  The Clinical status was relayed to family in detail. Brother [name] updated Patient has a son but the Son does NOT want anything to do with patient according to [facility]. Patient has progressive multiorgan failure-heart, lungs, cardio-vascualr, renal, and liver.   Will obtain palliative care consultation   Updated and notified of patients medical condition.  Patient remains unresponsive and will not open eyes to command.    Patient is having a weak cough and struggling to remove secretions.   Patient with increased WOB and using accessory muscles to breathe Explained to family course of therapy and the modalities     Patient with Progressive multiorgan failure with a very high probablity of a very minimal chance of meaningful recovery despite all aggressive and optimal medical therapy. Patient is in the Dying  Process associated with Suffering.  Family understands the situation.  He has consented and agreed to DNR/DNI status  Family are satisfied with Plan of action and management. All questions answered  Additional CC time 35 mins   Kadience Macchi Santiago Glad, M.D.  [facility]  Medical Director [facility] Medical Director [facility] Cardio-Pulmonary Department

## 2020-08-22 NOTE — Progress Notes (Addendum)
Pt admitted to ICU from Cath lab.   Post Cath site in Left Fem. No s/s of bleeding or swelling.   Pt arrived to ICU on mx gtt's:  Prop, Fent, Amiodarone, Angiomax, Cangrelor, and Levo.   PRN Vec given to promote Vent synchrony.  Left IJ triple lumen placed. New lines + bags started for central line.   Pt transitioned from Prop to Versed. Vaso added.   Per Cathlab Handoff: Cangrelor stopped @ 0000. Angiomax stopped @ 0200.   Pt now on:  Levo @ 20  Vaso @ .03  Fent @ 150  Versed @ 4  Amiodarone  Pt's right leg progressively mottled since arrival to ICU. ICU NP notified, Vascular consulted. Right popliteal pulse present w/ doppler. Right dorsalis pedis and posterior tibial pulses absent.   Pt taken to CT this AM. See results.  Foley in place.

## 2020-08-22 NOTE — Progress Notes (Signed)
Late entry: pt present as resuscitated code. Upon arrival to ED room pt on nrb/ cyanotic extremely agitated. Pt intubated on 1st attempt by ER MD see note for details bbs noted (+) etoc2 cxr verified.. Pt w/ ongoing hypoxia on vent support. Pt bagged briefly before cath lab. While in cath lab observed pt to have sustained desaturations 100% o2 breaths given. Pt would eventually require bagging to ICU 2' desaturations. ICU Rt made aware upon bedside. Critical care @ bedside. Pt lavaged and sx'n for minute secretions. bbs decreased coarse t/o. Vent settings adjusted to optimize oxygenation. Care turned over to ICU RT.

## 2020-08-22 NOTE — Progress Notes (Signed)
[facility]    SUBJECTIVE: Patient currently intubated sedated   Vitals:   08/22/20 0600 08/22/20 0610 08/22/20 0620 08/22/20 0630  BP:   117/72 120/73  Pulse: 67 67 66 67  Resp: 10 19 (!) 30 (!) 30  Temp:   98.24 F (36.8 C) 98.42 F (36.9 C)  TempSrc:   Bladder Bladder  SpO2: 99% 98% 99% 99%  Weight:      Height:         Intake/Output Summary (Last 24 hours) at 08/22/2020 0819 Last data filed at 08/22/2020 0500 Gross per 24 hour  Intake --  Output 225 ml  Net -225 ml      PHYSICAL EXAM  General: Well developed, well nourished, in no acute distress on the vent HEENT:  Normocephalic and atramatic Neck:  No JVD.  Lungs: Clear bilaterally to auscultation and percussion. Heart: HRRR . Normal S1 and S2 without gallops or murmurs.  Abdomen: Bowel sounds are positive, abdomen soft and non-tender  Msk:  Back normal, normal gait. Normal strength and tone for age. Extremities: No clubbing, cyanosis or edema.   Neuro: Intubated sedated Psych:  Good affect, responds appropriately   LABS: Basic Metabolic Panel: Recent Labs    08/22/2020 2047 09/01/2020 2052 08/22/20 0500  NA  --  139 138  K  --  4.1 5.3*  CL  --  101 102  CO2  --  19* 27  GLUCOSE  --  269* 248*  BUN  --  16 22  CREATININE  --  1.63* 2.20*  CALCIUM  --  8.6* 7.4*  MG  --  2.4 2.3  PHOS 7.4*  --  7.4*   Liver Function Tests: Recent Labs    08/30/2020 2052 08/22/20 0500  AST 73* 256*  ALT 59* 288*  ALKPHOS 96 98  BILITOT 0.8 0.8  PROT 6.2* 5.9*  ALBUMIN 2.9* 2.6*   No results for input(s): LIPASE, AMYLASE in the last 72 hours. CBC: Recent Labs    09/01/2020 2052 08/22/20 0500  WBC 17.7* 20.7*  NEUTROABS 8.9* 19.2*  HGB 12.5* 12.4*  HCT 39.6 39.0  MCV 96.1 94.2  PLT 286 364   Cardiac Enzymes: No results for input(s): CKTOTAL, CKMB, CKMBINDEX, TROPONINI in the last 72 hours. BNP: Invalid input(s): POCBNP D-Dimer: Recent Labs    08/18/2020 2052 08/22/20 0500  DDIMER 11.35* >20.00*    Hemoglobin A1C: No results for input(s): HGBA1C in the last 72 hours. Fasting Lipid Panel: No results for input(s): CHOL, HDL, LDLCALC, TRIG, CHOLHDL, LDLDIRECT in the last 72 hours. Thyroid Function Tests: No results for input(s): TSH, T4TOTAL, T3FREE, THYROIDAB in the last 72 hours.  Invalid input(s): FREET3 Anemia Panel: Recent Labs    08/22/20 0500  FERRITIN 3,414*    CT HEAD WO CONTRAST  Result Date: 08/22/2020 CLINICAL DATA:  60 year old male status post cardiac arrest, STEMI. EXAM: CT HEAD WITHOUT CONTRAST TECHNIQUE: Contiguous axial images were obtained from the base of the skull through the vertex without intravenous contrast. COMPARISON:  Brain MRI 08/19/2016. FINDINGS: Brain: No midline shift, mass effect, or evidence of intracranial mass lesion. No ventriculomegaly. No acute intracranial hemorrhage identified. No cortically based acute infarct identified. Gray-white matter differentiation appears symmetric and within normal limits throughout the brain. Normal basilar cisterns. Vascular: Extensive Calcified atherosclerosis at the skull base. Intravascular contrast appears to be present. No suspicious intracranial vascular hyperdensity. Skull: No acute osseous abnormality identified. Sinuses/Orbits: Scattered sinus fluid, mucosal thickening and opacification. Fluid in the nasal cavity  and nasopharynx. Intubated. Tympanic cavities and mastoids remain well aerated. Other: Generalized scalp soft tissue edema. Right anterior vertex broad-based scalp hematoma. Incidental small benign left vertex scalp lipoma. IMPRESSION: 1. Intravascular contrast suspected. Normal CT appearance of the brain. 2. Generalized scalp soft tissue edema and right vertex scalp hematoma. 3. Intubated.  Fluid in the pharynx, paranasal sinuses. Electronically Signed   By: Odessa Fleming M.D.   On: 08/22/2020 06:19   DG Chest Port 1 View  Result Date: 08/22/2020 CLINICAL DATA:  Central line placement EXAM: PORTABLE CHEST 1  VIEW COMPARISON:  08/24/2020 FINDINGS: Left central line is in place with the tip in the upper SVC. No pneumothorax. Endotracheal tube and NG tube are unchanged. Diffuse bilateral airspace disease, worsening on the left since prior study. IMPRESSION: Left central line tip in the upper SVC.  No pneumothorax. Diffuse bilateral airspace disease, worsening on the left since prior study Electronically Signed   By: Charlett Nose M.D.   On: 08/22/2020 01:03   DG Chest Portable 1 View  Result Date: 08/17/2020 CLINICAL DATA:  Status post intubation.  Field STEMI. EXAM: PORTABLE CHEST 1 VIEW COMPARISON:  Chest x-ray 03/05/2014 FINDINGS: Endotracheal tube with tip approximately 3 cm above the carina. Enteric tube coursing below the hemidiaphragm with tip collimated off view and side port overlying the gastric lumen. The heart size and mediastinal contours are unchanged. Aortic arch calcifications. No focal consolidation. Increased interstitial markings. Right costophrenic angle collimated off view. No pleural effusion. No pneumothorax. No acute osseous abnormality. IMPRESSION: Endotracheal tube and enteric tube in grossly appropriate position. Pulmonary edema. Electronically Signed   By: Tish Frederickson M.D.   On: 08/20/2020 21:14     Echo pending  TELEMETRY: Normal sinus rhythm rate of around 65 nonspecific findings  ASSESSMENT AND PLAN:  Active Problems:   STEMI involving left anterior descending coronary artery (HCC) Status post PCI and stent to proximal and mid LAD Acute cardiac arrest Covid positive Peripheral vascular disease Hyperlipidemia Respiratory failure Acute on chronic renal insufficiency Mild to moderate COPD Elevated LFTs suggestive of hepatitis  Plan Agree with admission and treatment in intensive care unit Continue respiratory support and care on the vent wean when appropriate Continue aspirin Brilinta for at least 12 months Recommend ACE ARB or Entresto for cardiomyopathy as well  as beta-blocker spironolactone Consider starting Farxiga (empagliflozin) for cardiomyopathy Continue therapy for peripheral vascular disease with outpatient follow-up with vascular Continue to follow-up liver enzymes consider CT of the abdomen and pelvis Statin therapy Lipitor 80 mg a day Refer the patient to heart failure clinic upon discharge Consider LifeVest prior to discharge Continue amiodarone but transition to p.o. 400 twice a day Recommend  inhalers as necessary for COPD Consider therapy for alcoholism CIWA protocol Maintain supportive care no further invasive cardiac procedures recommended at this point      Alwyn Pea, MD 08/22/2020 8:19 AM

## 2020-08-22 NOTE — Consult Note (Signed)
Consultation Note Date: 08/22/2020   Patient Name: Vincent [name]  DOB: [birth year]  MRN: [id number]  Age / Sex: 60 y.o., male  PCP: Nat Christen, PA-C Referring Physician: Flora Lipps, MD  Reason for Consultation: Establishing goals of care  HPI/Patient Profile: 60 y.o. male  with past medical history of ETOH abuse, PVD, HTN, DVT, CAD, COPD, and CKD admitted on 08/16/2020 with cardiac arrest. CPR initiated, received 1 shock and ROSC obtained. Patient required intubation in ED. Pt transported to cath lab for emergent cardiac catheterization. Cardiac cath revealed 100% occluded proximal LAD IRA requiring PCI and stent DES overlapping 2 stent to the proximal to mid LAD. Now requiring vasopressors. Also with AKI. Found to be COVID positive. Found to have mottling of RLE - very high risk for limb loss. PMT consulted to assist with Vincent [name].  Clinical Assessment and Goals of Care: I have reviewed medical records including EPIC notes, labs and imaging, received report from Dr. Mortimer Fries, assessed the patient and then spoke with patient's brother Vincent [name] to discuss diagnosis prognosis, Vincent [name], Vincent [name] wishes, disposition and options.  I introduced Palliative Medicine as specialized medical care for people living with serious illness. It focuses on providing relief from the symptoms and stress of a serious illness. The goal is to improve quality of life for both the patient and the family.  We discussed a brief life review of the patient. Vincent [name] tells me he lived about 8 hours from the patient but they did keep in touch. He tells me the patient just returned from [facility] - he was in a pool tournament. He also tells me the patient was working for an Associate Professor.   Vincent [name] tells me their parents are deceased. The patient is not married. Their 2 other siblings are deceased. The patient has one adult son however they are estranged and Vincent [name] does not believe he would want  part in the decision making process.   We discussed that technically Vincent [name]'s son is his next of kin and would be his decision maker - discussed attempting to contact son and if he does not want to make decisions he can relinquish his decision making ability to Vincent [name] plans to reach out to some extended family and utilize social media to establish contact with patient's son. I also found 2 numbers for the son online - called both with no answer and no way to leave voicemail. Will discuss with social work as well. Once we have performed our due diligence to contact son, if we cannot establish contact - patient's brother may serve as Media planner as next of kin reasonably available.    We discussed patient's current illness and what it means in the larger context of patient's on-going co-morbidities.  Natural disease trajectory and expectations at Vincent [name] were discussed. We discuss his cardiac arrest and current dependence on ventilator and medications to support his BP. We also discussed concern about kidneys and concern about limb loss.   I attempted to elicit values and goals of care important to the patient.  Vincent [name] shares that their father had an MI and died in 2000-07-13. He tells me at they time he and Vincent [name] discussed their own wishes for their healthcare - Vincent [name] told Vincent [name] he would never want to be on life support. Vincent [name] shares further that he does not believe Vincent [name] would want his leg amputated - he would rather allow natural disease progression.   Discussed with Vincent [name] the importance of continued conversation with family and the  medical providers regarding overall plan of care and treatment options, ensuring decisions are within the context of the patient's values and GOCs.    Reviewed plan with Vincent [name]: to continue to support Vincent [name]'s body with the ventilator and medications; however, if Vincent [name]'s heart stops despite all aggressive medical care the medical team will allow a natural death.  Vincent [name] agrees with this. We discuss both Vincent [name] and medical team attempting to establish contact with Vincent [name]'s son - Vincent [name] agrees and will follow up with me tomorrow.   Questions and concerns were addressed. The family was encouraged to call with questions or concerns.   Primary Decision Maker NEXT OF KIN - for now, his brother Vincent [name] - there is an estranged son we are attempting to establish contact with   SUMMARY OF RECOMMENDATIONS    - continue vent support/medications; DNR - attempting to establish contact with son who is next of kin; will follow up with brother today and discuss with social work - I have already attempted 2 numbers found online under son's name - PMT will follow  Code Status/Advance Care Planning:  DNR  Discharge Planning: To Be Determined      Primary Diagnoses: Present on Admission: . STEMI involving left anterior descending coronary artery (Vincent [name])   I have reviewed the medical record, interviewed the patient and family, and examined the patient. The following aspects are pertinent.  Past Medical History:  Diagnosis Date  . Alcoholic ([place])   . Chronic kidney disease   . COPD (chronic obstructive pulmonary disease) ([place])   . Coronary artery disease   . DVT (deep venous thrombosis) ([place])   . Heartburn   . Hypertension   . Lymphedema   . Peripheral vascular disease ([place])   . Varicose veins    Social History   Socioeconomic History  . Marital status: Married    Spouse name: Not on file  . Number of children: Not on file  . Years of education: Not on file  . Highest education level: Not on file  Occupational History  . Not on file  Tobacco Use  . Smoking status: Current Every Day Smoker    Packs/day: 1.00    Years: 49.00    Pack years: 49.00  . Smokeless tobacco: Never Used  Substance and Sexual Activity  . Alcohol use: No  . Drug use: No  . Sexual activity: Not on file  Other Topics Concern  . Not on file  Social History  Narrative  . Not on file   Social Determinants of Health   Financial Resource Strain: Not on file  Food Insecurity: Not on file  Transportation Needs: Not on file  Physical Activity: Not on file  Stress: Not on file  Social Connections: Not on file   Family History  Problem Relation Age of Onset  . Kidney disease Neg Hx   . Prostate cancer Neg Hx    Scheduled Meds: . [START ON 08/23/2020] vitamin C  500 mg Per Tube Daily  . aspirin  81 mg Per Tube Daily  . chlorhexidine gluconate (MEDLINE KIT)  15 mL Mouth Rinse BID  . Chlorhexidine Gluconate Cloth  6 each Topical Daily  . [START ON 08/23/2020] feeding supplement (PROSource TF)  45 mL Per Tube QID  . feeding supplement (VITAL AF 1.2 CAL)  1,000 mL Per Tube Q24H  . fentaNYL (SUBLIMAZE) injection  100 mcg Intravenous Once  . folic acid  1 mg Intravenous Daily  . free water  30 mL Per  Tube Q4H  . heparin injection (subcutaneous)  5,000 Units Subcutaneous Q8H  . hydrocortisone sod succinate (SOLU-CORTEF) inj  50 mg Intravenous Q6H  . insulin aspart  0-9 Units Subcutaneous Q4H  . mouth rinse  15 mL Mouth Rinse 10 times per day  . midodrine  5 mg Per Tube TID WC  . pantoprazole (PROTONIX) IV  40 mg Intravenous Q24H  . thiamine injection  100 mg Intravenous Daily  . ticagrelor  90 mg Per Tube BID  . [START ON 08/23/2020] zinc sulfate  220 mg Per Tube Daily   Continuous Infusions: . sodium chloride Stopped (08/20/2020 2333)  . amiodarone 30 mg/hr (08/22/20 1300)  . fentaNYL infusion INTRAVENOUS 150 mcg/hr (08/22/20 1300)  . midazolam 3 mg/hr (08/22/20 1300)  . norepinephrine (LEVOPHED) Adult infusion 14 mcg/min (08/22/20 1300)  . vasopressin 0.03 Units/min (08/22/20 1300)   PRN Meds:.acetaminophen, ondansetron (ZOFRAN) IV, vecuronium No Known Allergies Review of Systems  Unable to perform ROS: Intubated    Vital Signs: BP 101/66   Pulse 61   Temp 98.6 F (37 C) (Bladder)   Resp (!) 30   Ht 5' 8" (1.727 m)   Wt 113.4 kg    SpO2 94%   BMI 38.01 kg/m  Pain Scale: CPOT       SpO2: SpO2: 94 % O2 Device:SpO2: 94 % O2 Flow Rate: .O2 Flow Rate (L/min): 15 L/min  IO: Intake/output summary:   Intake/Output Summary (Last 24 hours) at 08/22/2020 1544 Last data filed at 08/22/2020 1300 Gross per 24 hour  Intake 1175.67 ml  Output 300 ml  Net 875.67 ml    LBM: Last BM Date:  (PTA) Baseline Weight: Weight: 113.4 kg Most recent weight: Weight: 113.4 kg     Palliative Assessment/Data: PPS 10%    The above conversation was completed via telephone due to the visitor restrictions during the COVID-19 pandemic. Thorough chart review and discussion with necessary members of the care team was completed as part of assessment. All issues were discussed and addressed but no physical exam was performed.  Time Total: 65 minutes Greater than 50%  of this time was spent counseling and coordinating care related to the above assessment and plan.  Juel Burrow, DNP, AGNP-C Palliative Medicine Team [phone] Pager: [phone]

## 2020-08-22 NOTE — Progress Notes (Signed)
Initial Nutrition Assessment  DOCUMENTATION CODES:   Obesity unspecified  INTERVENTION:   Initiate Vital 1.2 @25ml /hr- Once pt is tolerating, recommend increasing by 69ml/hr q 8 hours until goal rate of 17ml/hr is reached  Pro-Source 43ml QID via tube, provides 40kcal and 11g of protein per serving   Free water flushes 69ml q4 hours to maintain tube patency   Regimen provides 1744kcal/day, 143g/day protein and 1216ml/day free water   Pt at high refeed risk; recommend monitor potassium, magnesium and phosphorus labs daily until stable  NUTRITION DIAGNOSIS:   Inadequate oral intake related to inability to eat (pt sedated and ventilated) as evidenced by NPO status.  GOAL:   Provide needs based on ASPEN/SCCM guidelines  MONITOR:   Vent status,Labs,Weight trends,TF tolerance,Skin,I & O's  REASON FOR ASSESSMENT:   Consult Enteral/tube feeding initiation and management  ASSESSMENT:   60 yo male with h/o HTN, BPH, COPD, CAD, CKD and etoh abuse who is admitted with cardiac arrest on 04/6 secondary to STEMI and COVID 19.   Pt sedated and ventilated. OGT in place. Plan is to start trickle feeds today. Pressors decreasing. There is no documented weight history in chart to determine if any recent significant changes.   Medications reviewed and include: vitamin C, aspirin, folic acid, heparin, solu-cortef, insulin, protonix, thiamine, zinc, fentanyl, levophed, vasopressin   Labs reviewed: BUN 25(H), creat 2.53(H) Wbc- 18.2(H) cbgs- 237, 220 x 24 hrs  Patient is currently intubated on ventilator support MV: 14.4 L/min Temp (24hrs), Avg:98.3 F (36.8 C), Min:97.52 F (36.4 C), Max:99.14 F (37.3 C)  Propofol: none   MAP- >67mmHg  UOP- 79m  NUTRITION - FOCUSED PHYSICAL EXAM:  Flowsheet Row Most Recent Value  Orbital Region No depletion  Upper Arm Region No depletion  Thoracic and Lumbar Region No depletion  Buccal Region No depletion  Temple Region No depletion   Clavicle Bone Region No depletion  Clavicle and Acromion Bone Region No depletion  Scapular Bone Region No depletion  Dorsal Hand No depletion  Patellar Region No depletion  Anterior Thigh Region No depletion  Posterior Calf Region No depletion  Edema (RD Assessment) None  Hair Reviewed  Eyes Reviewed  Mouth Reviewed  Skin Reviewed  Nails Reviewed     Diet Order:   Diet Order            Diet NPO time specified  Diet effective now                EDUCATION NEEDS:   No education needs have been identified at this time  Skin:  Skin Assessment: Reviewed RN Assessment  Last BM:  PTA  Height:   Ht Readings from Last 1 Encounters:  09/06/20 5\' 8"  (1.727 m)    Weight:   Wt Readings from Last 1 Encounters:  09-06-2020 113.4 kg    Ideal Body Weight:  70 kg  BMI:  Body mass index is 38.01 kg/m.  Estimated Nutritional Needs:   Kcal:  1745kcal/day  Protein:  >140g/day  Fluid:  2.1-2.4L/day  MS, RD, LDN Please refer to [facility] for RD and/or RD on-call/weekend/after hours pager

## 2020-08-22 NOTE — Progress Notes (Signed)
NAME:  Vincent [name], MRN:  [id number], DOB:  08/05/1960, LOS: 1 ADMISSION DATE:  08-27-2020 60 yo male who presented to [facility] ER via EMS post cardiac arrest on 04/6.  Per ER notes EMS reported the pt was seated at a local bar when he suddenly became unresponsive and fell off a barstool.  EMS and first responders notified and upon their arrival pt pulseless, CPR started and AED applied.  Pt required 1 shock, and ROSC achieved immediately. EMS inserted a King airway in the field.  Code STEMI initiated.  En route to the ER pt became agitated and delirious, therefore EMS administered versed.  However, despite versed the pt ripped his Brooke Dare airway out.  ED course Upon arrival to the ER pt remained altered and agitated with NRB in place and unable to redirect. Therefore, pt required mechanical intubation.  EKG suggestive of ST elevation anteriorly.  ER physician contacted on call Cardiologist Dr. Juliann Pares and pt transported to cath lab for emergent cardiac catheterization.   Cardiac cath revealed 100% occluded proximal LAD IRA requiring PCI and stent DES overlapping 2 stent to the proximal to mid LAD.  Post procedure pt admitted to ICU and PCCM team consulted to assist with management.      Pertinent  Medical History  Varicose Veins PVD Lymphedema HTN Heartburn DVT CAD COPD CKD Former ETOH Abuse   Significant Hospital Events: Including procedures, antibiotic start and stop dates in addition to other pertinent events    4/6 with anterior STEMI requiring emergent mechanical intubation and cardiac cath   Pt incidentally found to be COVID-19 positive  Remdesivir 04/6>>  Left IJ CVL 04/7>>  4/7 multiorgan failure, severe hypoxia CT HEAD NEG    Interim History / Subjective:  Remains critically ill Severe hypoxia Vent Mode: PRVC FiO2 (%):  [100 %] 100 % Set Rate:  [20 bmp-30 bmp] 30 bmp Vt Set:  [470 mL-500 mL] 470 mL PEEP:  [10 cmH20-14 cmH20] 14 cmH20 Full vent  support progressive resp and renal failure     Micro Data:  COVID 19 + INF A/B NEG 4/6 RESP CX pending   Antimicrobials:   Anti-infectives (From admission, onward)   Start     Dose/Rate Route Frequency Ordered Stop   08/22/20 1000  remdesivir 100 mg in sodium chloride 0.9 % 100 mL IVPB       "Followed by" Linked Group Details   100 mg 200 mL/hr over 30 Minutes Intravenous Daily 08/27/2020 2304 09/02/2020 0959   08/22/20 0000  remdesivir 200 mg in sodium chloride 0.9% 250 mL IVPB       "Followed by" Linked Group Details   200 mg 580 mL/hr over 30 Minutes Intravenous Once 27-Aug-2020 2304 08/22/20 0145         Objective   Blood pressure 120/73, pulse 67, temperature 98.42 F (36.9 C), temperature source Bladder, resp. rate (!) 30, height 5\' 8"  (1.727 m), weight 113.4 kg, SpO2 99 %.    Vent Mode: PRVC FiO2 (%):  [100 %] 100 % Set Rate:  [20 bmp-30 bmp] 30 bmp Vt Set:  [470 mL-500 mL] 470 mL PEEP:  [10 cmH20-14 cmH20] 14 cmH20   Intake/Output Summary (Last 24 hours) at 08/22/2020 0736 Last data filed at 08/22/2020 0500 Gross per 24 hour  Intake --  Output 225 ml  Net -225 ml   Filed Weights   27-Aug-2020 2057  Weight: 113.4 kg      REVIEW OF SYSTEMS  PATIENT IS UNABLE TO  PROVIDE COMPLETE REVIEW OF SYSTEMS DUE TO SEVERE CRITICAL ILLNESS AND TOXIC METABOLIC ENCEPHALOPATHY   PHYSICAL EXAMINATION:  GENERAL:critically ill appearing, +resp distress HEAD: Normocephalic, atraumatic.  EYES: Pupils equal, round, reactive to light.  No scleral icterus.  MOUTH: Moist mucosal membrane. NECK: Supple. No thyromegaly. No nodules. No JVD.  PULMONARY: +rhonchi, +wheezing CARDIOVASCULAR: S1 and S2. Regular rate and rhythm. No murmurs, rubs, or gallops.  GASTROINTESTINAL: Soft, nontender, -distended. Positive bowel sounds.  MUSCULOSKELETAL: No swelling, clubbing, or edema.  NEUROLOGIC: obtunded SKIN:intact,warm,dry   Labs/imaging that I havepersonally reviewed  (right click and  "Reselect all SmartList Selections" daily)    Resolved Hospital Problem list   NA    ASSESSMENT AND PLAN SYNOPSIS  60 yo white male with sudden cardiac arest with acute STEMI 100% LAD occlusion with ischemic cardiomyopathy with COVID 19 infection with severe hypoxic resp failure with progressive renal failure, patient with progressive multiorgan failure   Severe ACUTE Hypoxic and Hypercapnic Respiratory Failure -continue Mechanical Ventilator support -continue Bronchodilator Therapy -Wean Fio2 and PEEP as tolerated -VAP/VENT bundle implementation  CARDIAC FAILURE-acute systolic/diastolic dysfunction -oxygen as needed Vent support -follow up cardiac enzymes as indicated Follow up cardiology recs   CARDIAC ICU monitoring   ACUTE KIDNEY INJURY/Renal Failure -continue Foley Catheter-assess need -Avoid nephrotoxic agents -Follow urine output, BMP -Ensure adequate renal perfusion, optimize oxygenation -Renal dose medications   NEUROLOGY Acute toxic metabolic encephalopathy, need for sedation Goal RASS -2 to -3 CT head NEG    CARDIOGENIC  SHOCK -use vasopressors to keep MAP>65 as needed -follow ABG and LA -follow up cultures -emperic ABX -consider stress dose steroids  INFECTIOUS DISEASE COVID 19 infection -continue REMDESIVIR as prescribed -follow up cultures   ENDO - ICU hypoglycemic\Hyperglycemia protocol -check FSBS per protocol   GI GI PROPHYLAXIS as indicated  NUTRITIONAL STATUS DIET-->TF's as tolerated Constipation protocol as indicated   ELECTROLYTES -follow labs as needed -replace as needed -pharmacy consultation and following     Best practice (right click and "Reselect all SmartList Selections" daily)  Diet:  NPO Pain/Anxiety/Delirium protocol (if indicated): Yes (RASS goal -2) VAP protocol (if indicated): Yes DVT prophylaxis: Subcutaneous Heparin GI prophylaxis: PPI Glucose control:  SSI Yes Central venous access:  Yes, and it  is still needed Arterial line:  N/A Foley:  Yes, and it is still needed Mobility:  bed rest  PT consulted: N/A Code Status:  full code Disposition: ICU  Labs   CBC: Recent Labs  Lab 09/06/2020 2052 08/22/20 0500  WBC 17.7* 20.7*  NEUTROABS 8.9* 19.2*  HGB 12.5* 12.4*  HCT 39.6 39.0  MCV 96.1 94.2  PLT 286 364    Basic Metabolic Panel: Recent Labs  Lab 09/04/2020 2047 08/16/2020 2052 08/22/20 0500  NA  --  139 138  K  --  4.1 5.3*  CL  --  101 102  CO2  --  19* 27  GLUCOSE  --  269* 248*  BUN  --  16 22  CREATININE  --  1.63* 2.20*  CALCIUM  --  8.6* 7.4*  MG  --  2.4 2.3  PHOS 7.4*  --  7.4*   GFR: Estimated Creatinine Clearance: 43.1 mL/min (A) (by C-G formula based on SCr of 2.2 mg/dL (H)). Recent Labs  Lab 09/04/2020 2052 08/22/20 0500  WBC 17.7* 20.7*    Liver Function Tests: Recent Labs  Lab 08/27/2020 2052 08/22/20 0500  AST 73* 256*  ALT 59* 288*  ALKPHOS 96 98  BILITOT 0.8 0.8  PROT  6.2* 5.9*  ALBUMIN 2.9* 2.6*   No results for input(s): LIPASE, AMYLASE in the last 168 hours. No results for input(s): AMMONIA in the last 168 hours.  ABG    Component Value Date/Time   PHART 7.16 (LL) 08/22/2020 0538   PCO2ART 72 (HH) 08/22/2020 0538   PO2ART 119 (H) 08/22/2020 0538   HCO3 25.7 08/22/2020 0538   ACIDBASEDEF 4.4 (H) 08/22/2020 0538   O2SAT 97.5 08/22/2020 0538     Coagulation Profile: Recent Labs  Lab 08-30-2020 2052  INR 1.3*    Cardiac Enzymes: No results for input(s): CKTOTAL, CKMB, CKMBINDEX, TROPONINI in the last 168 hours.  HbA1C: Hemoglobin A1C  Date/Time Value Ref Range Status  03/05/2014 11:20 AM 6.2 4.2 - 6.3 % Final    Comment:    The American Diabetes Association recommends that a primary goal of therapy should be <7% and that physicians should reevaluate the treatment regimen in patients with HbA1c values consistently >8%.     CBG: Recent Labs  Lab 2020-08-30 2252  GLUCAP 237*    Allergies No Known Allergies      DVT/GI PRX  assessed I Assessed the need for Labs I Assessed the need for Foley I Assessed the need for Central Venous Line Family Discussion when available I Assessed the need for Mobilization I made an Assessment of medications to be adjusted accordingly Safety Risk assessment completed  CASE DISCUSSED IN MULTIDISCIPLINARY ROUNDS WITH ICU TEAM     Critical Care Time devoted to patient care services described in this note is 59 minutes.   Overall, patient is critically ill, prognosis is guarded.  Patient with Multiorgan failure and at high risk for cardiac arrest and death.    Lucie Leather, M.D.  [facility]  Medical Director [facility] Medical Director [facility] Cardio-Pulmonary Department

## 2020-08-22 NOTE — Procedures (Signed)
Central Venous Catheter Insertion Procedure Note  Vincent [name]  [id number]  09-26-1960  Date:08/22/20  Time:12:25 AM   Provider Performing:[facility]   Procedure: Insertion of Non-tunneled Central Venous [phone]) with US guidance ([id number])   Indication(s) Medication administration  Consent Risks of the procedure as well as the alternatives and risks of each were explained to the patient and/or caregiver.  Consent for the procedure was obtained and is signed in the bedside chart  Anesthesia Topical only with 1% lidocaine   Timeout Verified patient identification, verified procedure, site/side was marked, verified correct patient position, special equipment/implants available, medications/allergies/relevant history reviewed, required imaging and test results available.  Sterile Technique Maximal sterile technique including full sterile barrier drape, hand hygiene, sterile gown, sterile gloves, mask, hair covering, sterile ultrasound probe cover (if used).  Procedure Description Area of catheter insertion was cleaned with chlorhexidine and draped in sterile fashion.  With real-time ultrasound guidance a central venous catheter was placed into the left internal jugular vein. Nonpulsatile blood flow and easy flushing noted in all ports.  The catheter was sutured in place and sterile dressing applied.  Complications/Tolerance None; patient tolerated the procedure well. Chest X-ray is ordered to verify placement for internal jugular or subclavian cannulation.   Chest x-ray is not ordered for femoral cannulation.  EBL Minimal  Specimen(s) None  [name], AGNP  Pulmonary/Critical Care Pager [phone] (please enter 7 digits) PCCM Consult Pager [phone] (please enter 7 digits)

## 2020-08-22 NOTE — Progress Notes (Signed)
Pt transported to CT and returned to ICU 4 without incident. Pt remained on the vent and is tol well at this time.

## 2020-08-23 LAB — CBC WITH DIFFERENTIAL/PLATELET
Abs Immature Granulocytes: 0.16 10*3/uL — ABNORMAL HIGH (ref 0.00–0.07)
Basophils Absolute: 0 10*3/uL (ref 0.0–0.1)
Basophils Relative: 0 %
Eosinophils Absolute: 0 10*3/uL (ref 0.0–0.5)
Eosinophils Relative: 0 %
HCT: 36.8 % — ABNORMAL LOW (ref 39.0–52.0)
Hemoglobin: 11.6 g/dL — ABNORMAL LOW (ref 13.0–17.0)
Immature Granulocytes: 1 %
Lymphocytes Relative: 4 %
Lymphs Abs: 0.6 10*3/uL — ABNORMAL LOW (ref 0.7–4.0)
MCH: 29.7 pg (ref 26.0–34.0)
MCHC: 31.5 g/dL (ref 30.0–36.0)
MCV: 94.4 fL (ref 80.0–100.0)
Monocytes Absolute: 0.9 10*3/uL (ref 0.1–1.0)
Monocytes Relative: 6 %
Neutro Abs: 13.7 10*3/uL — ABNORMAL HIGH (ref 1.7–7.7)
Neutrophils Relative %: 89 %
Platelets: 240 10*3/uL (ref 150–400)
RBC: 3.9 MIL/uL — ABNORMAL LOW (ref 4.22–5.81)
RDW: 13.2 % (ref 11.5–15.5)
WBC: 15.5 10*3/uL — ABNORMAL HIGH (ref 4.0–10.5)
nRBC: 0 % (ref 0.0–0.2)

## 2020-08-23 LAB — GLUCOSE, CAPILLARY
Glucose-Capillary: 130 mg/dL — ABNORMAL HIGH (ref 70–99)
Glucose-Capillary: 133 mg/dL — ABNORMAL HIGH (ref 70–99)
Glucose-Capillary: 197 mg/dL — ABNORMAL HIGH (ref 70–99)
Glucose-Capillary: 198 mg/dL — ABNORMAL HIGH (ref 70–99)
Glucose-Capillary: 201 mg/dL — ABNORMAL HIGH (ref 70–99)
Glucose-Capillary: 235 mg/dL — ABNORMAL HIGH (ref 70–99)

## 2020-08-23 LAB — D-DIMER, QUANTITATIVE: D-Dimer, Quant: 18.34 ug/mL-FEU — ABNORMAL HIGH (ref 0.00–0.50)

## 2020-08-23 LAB — COMPREHENSIVE METABOLIC PANEL
ALT: 1683 U/L — ABNORMAL HIGH (ref 0–44)
AST: 1532 U/L — ABNORMAL HIGH (ref 15–41)
Albumin: 2.7 g/dL — ABNORMAL LOW (ref 3.5–5.0)
Alkaline Phosphatase: 94 U/L (ref 38–126)
Anion gap: 12 (ref 5–15)
BUN: 46 mg/dL — ABNORMAL HIGH (ref 8–23)
CO2: 24 mmol/L (ref 22–32)
Calcium: 7.4 mg/dL — ABNORMAL LOW (ref 8.9–10.3)
Chloride: 101 mmol/L (ref 98–111)
Creatinine, Ser: 4.2 mg/dL — ABNORMAL HIGH (ref 0.61–1.24)
GFR, Estimated: 15 mL/min — ABNORMAL LOW (ref >60)
Glucose, Bld: 243 mg/dL — ABNORMAL HIGH (ref 70–99)
Potassium: 5.8 mmol/L — ABNORMAL HIGH (ref 3.5–5.1)
Sodium: 137 mmol/L (ref 135–145)
Total Bilirubin: 0.5 mg/dL (ref 0.3–1.2)
Total Protein: 5.9 g/dL — ABNORMAL LOW (ref 6.5–8.1)

## 2020-08-23 LAB — FERRITIN: Ferritin: 5185 ng/mL — ABNORMAL HIGH (ref 24–336)

## 2020-08-23 LAB — C-REACTIVE PROTEIN: CRP: 16.5 mg/dL — ABNORMAL HIGH (ref <1.0)

## 2020-08-23 LAB — PHOSPHORUS: Phosphorus: 9.5 mg/dL — ABNORMAL HIGH (ref 2.5–4.6)

## 2020-08-23 LAB — MAGNESIUM: Magnesium: 2.3 mg/dL (ref 1.7–2.4)

## 2020-08-23 LAB — POTASSIUM
Potassium: 6 mmol/L — ABNORMAL HIGH (ref 3.5–5.1)
Potassium: 5.3 mmol/L — ABNORMAL HIGH (ref 3.5–5.1)

## 2020-08-23 LAB — LACTIC ACID, PLASMA: Lactic Acid, Venous: 1.4 mmol/L (ref 0.5–1.9)

## 2020-08-23 MED ORDER — INSULIN ASPART 100 UNIT/ML ~~LOC~~ SOLN
3.0000 [IU] | SUBCUTANEOUS | Status: DC
  Administered 2020-08-23 – 2020-08-26 (×12): 3 [IU] via SUBCUTANEOUS
  Filled 2020-08-23 (×15): qty 1

## 2020-08-23 MED ORDER — DEXTROSE 50 % IV SOLN
1.0000 | Freq: Once | INTRAVENOUS | Status: AC
  Administered 2020-08-23: 50 mL via INTRAVENOUS
  Filled 2020-08-23: qty 50

## 2020-08-23 MED ORDER — POLYETHYLENE GLYCOL 3350 17 G PO PACK
17.0000 g | PACK | Freq: Every day | ORAL | Status: DC
  Administered 2020-08-23 – 2020-08-25 (×3): 17 g
  Filled 2020-08-23 (×3): qty 1

## 2020-08-23 MED ORDER — INSULIN ASPART 100 UNIT/ML IV SOLN
10.0000 [IU] | Freq: Once | INTRAVENOUS | Status: AC
  Administered 2020-08-23: 10 [IU] via INTRAVENOUS
  Filled 2020-08-23: qty 0.1

## 2020-08-23 MED ORDER — DILTIAZEM HCL 25 MG/5ML IV SOLN
5.0000 mg | Freq: Once | INTRAVENOUS | Status: AC
  Administered 2020-08-23: 5 mg via INTRAVENOUS
  Filled 2020-08-23: qty 5

## 2020-08-23 MED ORDER — FENTANYL CITRATE (PF) 100 MCG/2ML IJ SOLN: 50.0000 ug | INTRAMUSCULAR | Status: DC | PRN

## 2020-08-23 MED ORDER — MIDAZOLAM HCL 2 MG/2ML IJ SOLN: 2.0000 mg | INTRAMUSCULAR | Status: DC | PRN

## 2020-08-23 MED ORDER — HEPARIN (PORCINE) 25000 UT/250ML-% IV SOLN
1350.0000 [IU]/h | INTRAVENOUS | Status: DC
  Administered 2020-08-24: 1350 [IU]/h via INTRAVENOUS
  Filled 2020-08-23: qty 250

## 2020-08-23 MED ORDER — DOCUSATE SODIUM 50 MG/5ML PO LIQD
100.0000 mg | Freq: Two times a day (BID) | ORAL | Status: DC
  Administered 2020-08-23 – 2020-08-25 (×5): 100 mg
  Filled 2020-08-23 (×5): qty 10

## 2020-08-23 MED ORDER — SODIUM ZIRCONIUM CYCLOSILICATE 5 G PO PACK
10.0000 g | PACK | Freq: Once | ORAL | Status: AC
  Administered 2020-08-23: 10 g
  Filled 2020-08-23: qty 2

## 2020-08-23 MED ORDER — ACETAMINOPHEN 325 MG PO TABS
650.0000 mg | ORAL_TABLET | ORAL | Status: DC | PRN
  Administered 2020-08-23: 650 mg
  Filled 2020-08-23: qty 2

## 2020-08-23 MED ORDER — SODIUM ZIRCONIUM CYCLOSILICATE 5 G PO PACK
10.0000 g | PACK | Freq: Four times a day (QID) | ORAL | Status: AC
  Administered 2020-08-23 – 2020-08-24 (×5): 10 g
  Filled 2020-08-23 (×5): qty 2

## 2020-08-23 NOTE — Progress Notes (Signed)
ANTICOAGULATION CONSULT NOTE  Pharmacy Consult for heparin infusion Indication: atrial fibrillation  No Known Allergies  Patient Measurements: Height: 5\' 8"  (172.7 cm) Weight: 113.4 kg (250 lb) IBW/kg (Calculated) : 68.4 Heparin Dosing Weight: 93.9 kg  Vital Signs: Temp: 100.9 F (38.3 C) (04/08 2330) BP: 95/56 (04/08 2330) Pulse Rate: 127 (04/08 2330)  Labs: Recent Labs    2020-09-18 2052 Sep 18, 2020 2254 08/22/20 0500 08/22/20 0941 08/22/20 1257 08/23/20 0520  HGB 12.5*  --  12.4* 12.9*  --  11.6*  HCT 39.6  --  39.0 40.7  --  36.8*  PLT 286  --  364 336  --  240  APTT 35  --   --   --   --   --   LABPROT 15.2  --   --   --   --   --   INR 1.3*  --   --   --   --   --   CREATININE 1.63*  --  2.20* 2.53*  --  4.20*  TROPONINIHS 2,918* 3,591*  --  4,109* 4,276*  --     Estimated Creatinine Clearance: 22.6 mL/min (A) (by C-G formula based on SCr of 4.2 mg/dL (H)).   Medical History: Past Medical History:  Diagnosis Date  . Alcoholic (HCC)   . Chronic kidney disease   . COPD (chronic obstructive pulmonary disease) (HCC)   . Coronary artery disease   . DVT (deep venous thrombosis) (HCC)   . Heartburn   . Hypertension   . Lymphedema   . Peripheral vascular disease (HCC)   . Varicose veins     Medications:   Pt ordered Brilinta 90mg  BID post Coronary Intervention Pt ordered Heparin 5000 units q8hr w/ last dose 4/8 @ 2159.  Assessment: Pt is 60 yo male with significant cardiovascular, pulmonary, & kidney disease now in Afib.  NP consulting pharmacy to dose therapeutic heparin.  Due to concomitant therapy with Brilinta, pharmacy asked to target lower end of therapeutic range and monitor platelets closely.  Goal of Therapy:  Heparin level 0.3-0.7 units/ml Monitor platelets by anticoagulation protocol: Yes   Plan:  DC heparin prophylaxis, w/ no bolus Start heparin infusion at 1350 units/hr Check HL in 6 hr after infusion started Monitor H&H and Plts at least  once daily or more frequently as needed   2160, PharmD, [facility] 08/24/2020 12:18 AM

## 2020-08-23 NOTE — Plan of Care (Signed)
  Problem: Education: Goal: Knowledge of risk factors and measures for prevention of condition will improve Outcome: Not Progressing   Problem: Coping: Goal: Psychosocial and spiritual needs will be supported Outcome: Not Progressing   Problem: Respiratory: Goal: Will maintain a patent airway Outcome: Not Progressing Goal: Complications related to the disease process, condition or treatment will be avoided or minimized Outcome: Not Progressing   

## 2020-08-23 NOTE — Progress Notes (Signed)
Patient RASS -4, Versed titrated down to 3 mcg. Pt abdomen firm with faint BS x 4, residual on assessment. OGT placed to low wall suction per NP. Will continue to monitor.

## 2020-08-23 NOTE — Progress Notes (Signed)
Inpatient Diabetes Program Recommendations  AACE/ADA: New Consensus Statement on Inpatient Glycemic Control (2015)  Target Ranges:  Prepandial:   less than 140 mg/dL      Peak postprandial:   less than 180 mg/dL (1-2 hours)      Critically ill patients:  140 - 180 mg/dL  Results for ODIN, MARIANI (MRN [id number]) as of 08/23/2020 08:47  Ref. Range 08/22/2020 16:32 08/22/2020 19:37 08/22/2020 23:46 08/23/2020 03:50 08/23/2020 07:28  Glucose-Capillary Latest Ref Range: 70 - 99 mg/dL 671 (H)  2 units NOVOLOG  147 (H)  1 unit NOVOLOG  193 (H)  2 units NOVOLOG  201 (H)  3 units NOVOLOG  235 (H)    Current Orders: Novolog 0-9 units Q4H    Getting Solucortef 50 mg Q6H  Remains intubated--Started Tube Feeds 25cc/hr yest at 1pm   MD- Note rise in CBGs since start of tube feedings--Also getting IV Steroids  May consider adding low dose Novolog Tube Feed Coverage:  Novolog 3 units Q4 hours  HOLD if tube feeds HELD for any reason    --Will follow patient during hospitalization--  Ambrose Finland RN, MSN, CDE Diabetes Coordinator Inpatient Glycemic Control Team Team Pager: [phone] (8a-5p)

## 2020-08-23 NOTE — Progress Notes (Incomplete)
ANTICOAGULATION CONSULT NOTE  Pharmacy Consult for heparin infusion Indication: atrial fibrillation  No Known Allergies  Patient Measurements: Height: 5\' 8"  (172.7 cm) Weight: 113.4 kg (250 lb) IBW/kg (Calculated) : 68.4 Heparin Dosing Weight: 93.9 kg  Vital Signs: Temp: 100.9 F (38.3 C) (04/08 2330) BP: 95/56 (04/08 2330) Pulse Rate: 127 (04/08 2330)  Labs: Recent Labs    08/28/2020 2052 09/04/2020 2254 08/22/20 0500 08/22/20 0941 08/22/20 1257 08/23/20 0520  HGB 12.5*  --  12.4* 12.9*  --  11.6*  HCT 39.6  --  39.0 40.7  --  36.8*  PLT 286  --  364 336  --  240  APTT 35  --   --   --   --   --   LABPROT 15.2  --   --   --   --   --   INR 1.3*  --   --   --   --   --   CREATININE 1.63*  --  2.20* 2.53*  --  4.20*  TROPONINIHS 2,918* 3,591*  --  4,109* 4,276*  --     Estimated Creatinine Clearance: 22.6 mL/min (A) (by C-G formula based on SCr of 4.2 mg/dL (H)).   Medical History: Past Medical History:  Diagnosis Date  . Alcoholic (HCC)   . Chronic kidney disease   . COPD (chronic obstructive pulmonary disease) (HCC)   . Coronary artery disease   . DVT (deep venous thrombosis) (HCC)   . Heartburn   . Hypertension   . Lymphedema   . Peripheral vascular disease (HCC)   . Varicose veins     Medications:  12-14-1992  Assessment: *** Goal of Therapy:  {OEHO:1224825} {Monitor platelets by anticoagulation protocol:3041561::"Monitor platelets by anticoagulation protocol: Yes"}   Plan:  {Plan:3041500}  Vincent [name],Vincent [name] 08/23/2020,11:54 PM

## 2020-08-23 NOTE — Progress Notes (Signed)
[facility] Cardiology    SUBJECTIVE: Intubated sedated   Vitals:   08/23/20 0500 08/23/20 0600 08/23/20 0700 08/23/20 0720  BP: (!) 109/57 113/61 (!) 103/58   Pulse: 67 71 68   Resp: (!) 30 (!) 24 (!) 34   Temp: (S) (!) 100.94 F (38.3 C) (!) 100.94 F (38.3 C) (!) 100.58 F (38.1 C)   TempSrc:      SpO2: 94% 90% (!) 84% 92%  Weight:      Height:         Intake/Output Summary (Last 24 hours) at 08/23/2020 0930 Last data filed at 08/23/2020 0700 Gross per 24 hour  Intake 2344.97 ml  Output 485 ml  Net 1859.97 ml      PHYSICAL EXAM  General: Well developed, well nourished, in no acute distress HEENT:  Normocephalic and atramatic Neck:  No JVD.  Lungs: Clear bilaterally to auscultation and percussion. Heart: HRRR . Normal S1 and S2 without gallops or murmurs.  Abdomen: Bowel sounds are positive, abdomen soft and non-tender  Msk:  Back normal, normal gait. Normal strength and tone for age. Extremities: No clubbing, cyanosis or edema.   Neuro: Alert and oriented X 3. Psych:  Good affect, responds appropriately   LABS: Basic Metabolic Panel: Recent Labs    08/22/20 0500 08/22/20 0941 08/23/20 0520  NA 138 140 137  K 5.3* 4.6 5.8*  CL 102 103 101  CO2 GLUCOSE 248* 231* 243*  BUN 22 25* 46*  CREATININE 2.20* 2.53* 4.20*  CALCIUM 7.4* 7.7* 7.4*  MG 2.3  --  2.3  PHOS 7.4*  --  9.5*   Liver Function Tests: Recent Labs    08/22/20 0500 08/23/20 0520  AST 256* 1,532*  ALT 288* 1,683*  ALKPHOS 98 94  BILITOT 0.8 0.5  PROT 5.9* 5.9*  ALBUMIN 2.6* 2.7*   No results for input(s): LIPASE, AMYLASE in the last 72 hours. CBC: Recent Labs    08/22/20 0500 08/22/20 0941 08/23/20 0520  WBC 20.7* 18.2* 15.5*  NEUTROABS 19.2*  --  13.7*  HGB 12.4* 12.9* 11.6*  HCT 39.0 40.7 36.8*  MCV 94.2 94.0 94.4  PLT 364 336 240   Cardiac Enzymes: No results for input(s): CKTOTAL, CKMB, CKMBINDEX, TROPONINI in the last 72 hours. BNP: Invalid input(s):  POCBNP D-Dimer: Recent Labs    08/22/20 0500 08/23/20 0520  DDIMER >20.00* 18.34*   Hemoglobin A1C: Recent Labs    08/22/20 0941  HGBA1C 6.4*   Fasting Lipid Panel: No results for input(s): CHOL, HDL, LDLCALC, TRIG, CHOLHDL, LDLDIRECT in the last 72 hours. Thyroid Function Tests: No results for input(s): TSH, T4TOTAL, T3FREE, THYROIDAB in the last 72 hours.  Invalid input(s): FREET3 Anemia Panel: Recent Labs    08/23/20 0520  FERRITIN 5,185*    DG Abd 1 View  Result Date: 08/22/2020 CLINICAL DATA:  Orogastric tube placement. EXAM: ABDOMEN - 1 VIEW COMPARISON:  Chest x-ray 08/22/2020. FINDINGS: Orogastric tube noted with tip over the stomach. Mild gastric distention. No bowel distention. No free air. Contrast noted in the kidneys. Degenerative changes and scoliosis thoracic spine. IMPRESSION: Orogastric tube noted with tip over the stomach. Mild gastric distention. Electronically Signed   By: Maisie Fus  Register   On: 08/22/2020 11:43   CT HEAD WO CONTRAST  Result Date: 08/22/2020 CLINICAL DATA:  60 year old male status post cardiac arrest, STEMI. EXAM: CT HEAD WITHOUT CONTRAST TECHNIQUE: Contiguous axial images were obtained from the base of the skull through the vertex without  intravenous contrast. COMPARISON:  Brain MRI 08/19/2016. FINDINGS: Brain: No midline shift, mass effect, or evidence of intracranial mass lesion. No ventriculomegaly. No acute intracranial hemorrhage identified. No cortically based acute infarct identified. Gray-white matter differentiation appears symmetric and within normal limits throughout the brain. Normal basilar cisterns. Vascular: Extensive Calcified atherosclerosis at the skull base. Intravascular contrast appears to be present. No suspicious intracranial vascular hyperdensity. Skull: No acute osseous abnormality identified. Sinuses/Orbits: Scattered sinus fluid, mucosal thickening and opacification. Fluid in the nasal cavity and nasopharynx. Intubated.  Tympanic cavities and mastoids remain well aerated. Other: Generalized scalp soft tissue edema. Right anterior vertex broad-based scalp hematoma. Incidental small benign left vertex scalp lipoma. IMPRESSION: 1. Intravascular contrast suspected. Normal CT appearance of the brain. 2. Generalized scalp soft tissue edema and right vertex scalp hematoma. 3. Intubated.  Fluid in the pharynx, paranasal sinuses. Electronically Signed   By: Odessa Fleming M.D.   On: 08/22/2020 06:19   CARDIAC CATHETERIZATION  Result Date: 08/22/2020  Prox RCA to Mid RCA lesion is 70% stenosed.  Prox Cx lesion is 25% stenosed.  Prox LAD lesion is 100% stenosed.  A drug-eluting stent was successfully placed using a STENT RESOLUTE ONYX 2.5X30.  Post intervention, there is a 0% residual stenosis.  Mid LAD lesion is 75% stenosed.  A drug-eluting stent was successfully placed using a STENT RESOLUTE ONYX 2.25X30.  Post intervention, there is a 0% residual stenosis.  Conclusion conclusion STEMI presentation with sudden death respiratory failure intubated sedated severely depressed left ventricular function globally ejection fraction less than 25%  Conclusion STEMI presentation anterior STEMI Complicated by sudden death respiratory failure intubated sedated Diagnostic procedure Severely depressed left ventricular function of less than 25% globally with moderate left ventricular enlargement Coronaries Left main large free of disease LAD large with 100% proximal occlusion IRA TIMI 0 flow Circumflex large with minor irregularities RCA large with moderate to severe mid disease TIMI-3 flow Intervention Successful PCI and stent of proximal and mid LAD with overlapping stents DES resolute Onyx Proximal 2.5 x 30 mm mid 2.25 x 30 mm Lesion reduced from 100% down to 0 proximally and 75% down to 0 mid TIMI 0 was transformed to TIMI-3 flow by the end of the case Minx was deployed in the left femoral artery Cangrelor infusion for 2 hours Angiomax at reduced  rate for an additional 4 hours Aspirin 325 and Brilinta 180 down the NG Amiodarone IV post arrest load and infusion Patient was maintained on the vent intubated sedated Patient was transported to intensive care for critical care management No complications  US ARTERIAL LOWER EXTREMITY DUPLEX RIGHT(NON-ABI)  Result Date: 08/22/2020 CLINICAL DATA:  60 year old male with cyanosis EXAM: UNILATERAL LOWER EXTREMITY ARTERIAL DUPLEX SCAN TECHNIQUE: Gray-scale sonography as well as color Doppler and duplex ultrasound was performed to evaluate the arteries of SINGLE lower EXTREMITY including the common, superficial and profunda femoral arteries, popliteal artery and calf arteries. COMPARISON:  None. FINDINGS: Right Lower Extremity Directed duplex demonstrates monophasic waveform of common femoral artery, profunda femoris. SFA is occluded to the popliteal artery. Reconstitution of the popliteal artery with monophasic waveform. Monophasic tibial arteries. Left Lower Extremity Directed duplex left lower extremity demonstrates monophasic common femoral artery IMPRESSION: Directed duplex right lower extremity demonstrates monophasic common femoral artery, occlusion of the SFA, and reconstitution of the popliteal artery. Monophasic waveform of the tibial arteries. Signed, Yvone Neu. Reyne Dumas, RPVI [facility] Radiology Electronically Signed   By: Gilmer Mor D.O.  On: 08/22/2020 14:34   DG Chest Port 1 View  Result Date: 08/22/2020 CLINICAL DATA:  Central line placement EXAM: PORTABLE CHEST 1 VIEW COMPARISON:  September 12, 2020 FINDINGS: Left central line is in place with the tip in the upper SVC. No pneumothorax. Endotracheal tube and NG tube are unchanged. Diffuse bilateral airspace disease, worsening on the left since prior study. IMPRESSION: Left central line tip in the upper SVC.  No pneumothorax. Diffuse bilateral airspace disease, worsening on the left since prior study  Electronically Signed   By: Charlett Nose M.D.   On: 08/22/2020 01:03   DG Chest Portable 1 View  Result Date: 2020-09-12 CLINICAL DATA:  Status post intubation.  Field STEMI. EXAM: PORTABLE CHEST 1 VIEW COMPARISON:  Chest x-ray 03/05/2014 FINDINGS: Endotracheal tube with tip approximately 3 cm above the carina. Enteric tube coursing below the hemidiaphragm with tip collimated off view and side port overlying the gastric lumen. The heart size and mediastinal contours are unchanged. Aortic arch calcifications. No focal consolidation. Increased interstitial markings. Right costophrenic angle collimated off view. No pleural effusion. No pneumothorax. No acute osseous abnormality. IMPRESSION: Endotracheal tube and enteric tube in grossly appropriate position. Pulmonary edema. Electronically Signed   By: Tish Frederickson M.D.   On: Sep 12, 2020 21:14   ECHOCARDIOGRAM COMPLETE  Result Date: 08/22/2020    ECHOCARDIOGRAM REPORT   Patient Name:   Vincent [name] Date of Exam: 08/22/2020 Medical Rec #:  [id number]       Height:       68.0 in Accession #:    [id number]      Weight:       250.0 lb Date of Birth:  07-09-1960       BSA:          2.247 m Patient Age:    60 years        BP:           102/58 mmHg Patient Gender: M               HR:           62 bpm. Exam Location:  [facility] Procedure: 2D Echo, Color Doppler, Cardiac Doppler and Intracardiac            Opacification Agent Indications:     I21.9 Myocardial Infarct  History:         Patient has no prior history of Echocardiogram examinations.                  CAD, COPD and CKD; Risk Factors:Hypertension. Pt tested                  positive for COVID-19 on 09/12/20.  Sonographer:     Humphrey Rolls RDCS (AE) Referring Phys:  [id number] [facility] D Kenyata Guess Diagnosing Phys: Harold Hedge MD  Sonographer Comments: Suboptimal parasternal window and echo performed with patient supine and on artificial respirator. Image acquisition challenging due to COPD. Global longitudinal strain was  attempted. IMPRESSIONS  1. Probable small apical thrombus. Left ventricular ejection fraction, by estimation, is 20 to 25%. The left ventricle has severely decreased function. The left ventricle demonstrates regional wall motion abnormalities (see scoring diagram/findings for description). The left ventricular internal cavity size was mildly dilated. Left ventricular diastolic parameters were normal.  2. Right ventricular systolic function is normal. The right ventricular size is normal.  3. The mitral valve is grossly normal. Mild mitral valve regurgitation.  4. The aortic valve is grossly normal. Aortic valve  regurgitation is not visualized. FINDINGS  Left Ventricle: Probable small apical thrombus. Left ventricular ejection fraction, by estimation, is 20 to 25%. The left ventricle has severely decreased function. The left ventricle demonstrates regional wall motion abnormalities. Definity contrast agent was given IV to delineate the left ventricular endocardial borders. The left ventricular internal cavity size was mildly dilated. There is borderline left ventricular hypertrophy. Left ventricular diastolic parameters were normal.  LV Wall Scoring: The apex is akinetic. The entire anterior wall, mid and distal lateral wall, entire anterior septum, entire inferior wall, posterior wall, mid anterolateral segment, and mid inferoseptal segment are hypokinetic. Right Ventricle: The right ventricular size is normal. No increase in right ventricular wall thickness. Right ventricular systolic function is normal. Left Atrium: Left atrial size was normal in size. Right Atrium: Right atrial size was normal in size. Pericardium: There is no evidence of pericardial effusion. Mitral Valve: The mitral valve is grossly normal. Mild mitral valve regurgitation. MV peak gradient, 2.8 mmHg. The mean mitral valve gradient is 1.0 mmHg. Tricuspid Valve: The tricuspid valve is not well visualized. Tricuspid valve regurgitation is trivial.  Aortic Valve: The aortic valve is grossly normal. Aortic valve regurgitation is not visualized. Aortic valve mean gradient measures 2.0 mmHg. Aortic valve peak gradient measures 2.7 mmHg. Aortic valve area, by VTI measures 3.20 cm. Pulmonic Valve: The pulmonic valve was not well visualized. Pulmonic valve regurgitation is trivial. Aorta: The aortic root is normal in size and structure. IAS/Shunts: The interatrial septum was not well visualized.  LEFT VENTRICLE PLAX 2D LVOT diam:     2.30 cm      Diastology LV SV:         41           LV e' medial:    5.66 cm/s LV SV Index:   18           LV E/e' medial:  14.9 LVOT Area:     4.15 cm     LV e' lateral:   10.00 cm/s                             LV E/e' lateral: 8.4  LV Volumes (MOD) LV vol d, MOD A2C: 76.7 ml LV vol d, MOD A4C: 158.0 ml LV vol s, MOD A2C: 55.7 ml LV vol s, MOD A4C: 136.0 ml LV SV MOD A2C:     21.0 ml LV SV MOD A4C:     158.0 ml LV SV MOD BP:      23.6 ml RIGHT VENTRICLE RV Basal diam:  2.80 cm LEFT ATRIUM             Index       RIGHT ATRIUM           Index LA Vol (A2C):   35.4 ml 15.75 ml/m RA Area:     10.10 cm LA Vol (A4C):   36.2 ml 16.11 ml/m RA Volume:   20.00 ml  8.90 ml/m LA Biplane Vol: 39.6 ml 17.62 ml/m  AORTIC VALVE AV Area (Vmax):    3.79 cm AV Area (Vmean):   3.71 cm AV Area (VTI):     3.20 cm AV Vmax:           81.60 cm/s AV Vmean:          57.400 cm/s AV VTI:            0.127 m AV Peak Grad:  2.7 mmHg AV Mean Grad:      2.0 mmHg LVOT Vmax:         74.40 cm/s LVOT Vmean:        51.300 cm/s LVOT VTI:          0.098 m LVOT/AV VTI ratio: 0.77  AORTA Ao Root diam: 3.10 cm MITRAL VALVE MV Area (PHT): 3.53 cm    SHUNTS MV Area VTI:   1.74 cm    Systemic VTI:  0.10 m MV Peak grad:  2.8 mmHg    Systemic Diam: 2.30 cm MV Mean grad:  1.0 mmHg MV Vmax:       0.83 m/s MV Vmean:      35.6 cm/s MV Decel Time: 215 msec MV E velocity: 84.40 cm/s MV A velocity: 30.00 cm/s MV E/A ratio:  2.81 Harold HedgeKenneth Fath MD Electronically signed by Harold HedgeKenneth  Fath MD Signature Date/Time: 08/22/2020/4:52:09 PM    Final      Echo Severely depressed LVF EF=20-25%  TELEMETRY: NSR 65 nsstw:  ASSESSMENT AND PLAN:  Active Problems:   STEMI involving left anterior descending coronary artery (HCC) Multisystem organ failure including shock liver Respiratory failure Sudden death Ventricular fibrillation arrest Cardiomyopathy Peripheral vascular disease Alcohol abuse Status post PCI and stent to proximal LAD Covid positive  Plan Agree with ICU level critical care Continue respiratory support with vent management Maintain IV amiodarone loading drip Continue aspirin and Brilinta down the NG tube Inhalers as necessary for COPD Consider prophylactic CIWA protocol Prophylactic therapy for reflux probably with Protonix DVT prophylaxis Continue aggressive critical care management for multisystem organ failure Consider CCR T because of worsening renal function Would recommend conservative cardiac input at this point    Vincent Peawayne D Kadance Mccuistion, MD 08/23/2020 9:30 AM

## 2020-08-23 NOTE — Plan of Care (Signed)
  Problem: Respiratory: Goal: Will maintain a patent airway Outcome: Progressing   

## 2020-08-23 NOTE — Progress Notes (Signed)
Atrial Fibrillation with Rapid Ventricular Response CHA2DS2-VASc score: 4 Patient had been on Amiodarone drip which was discontinued earlier on 08/23/20. 12- lead reveals Atrial Fibrillation with pauses.  - Cardizem x 1 - will hold off on amiodarone drip due to increasing pauses - Heparin drip per pharmacy consult - Cardiology following, appreciate input - Continuous cardiac monitoring - STAT BMP & Mg >> patient has been hyperkalemic > being treated   Vincent [name], AGACNP-BC Acute Care Nurse Practitioner [facility]   [phone] / [phone] Please see Amion for pager details.

## 2020-08-23 NOTE — Progress Notes (Signed)
Daily Progress Note   Patient Name: Vincent [name]       Date: 08/23/2020 DOB: 09-Oct-1960  Age: 60 y.o. MRN#: [id number] Attending Physician: Flora Lipps, MD Primary Care Physician: Nat Christen, PA-C Admit Date: 08/20/2020  Reason for Consultation/Follow-up: Establishing goals of care  Subjective: Intubated, sedated, covid precautions Per RN, increased vent support. Remains on pressors.  Worsening renal function.  Length of Stay: 2  Current Medications: Scheduled Meds:  . vitamin C  500 mg Per Tube Daily  . aspirin  81 mg Per Tube Daily  . chlorhexidine gluconate (MEDLINE KIT)  15 mL Mouth Rinse BID  . Chlorhexidine Gluconate Cloth  6 each Topical Daily  . feeding supplement (PROSource TF)  45 mL Per Tube QID  . feeding supplement (VITAL AF 1.2 CAL)  1,000 mL Per Tube Q24H  . fentaNYL (SUBLIMAZE) injection  100 mcg Intravenous Once  . folic acid  1 mg Intravenous Daily  . free water  30 mL Per Tube Q4H  . heparin injection (subcutaneous)  5,000 Units Subcutaneous Q8H  . hydrocortisone sod succinate (SOLU-CORTEF) inj  50 mg Intravenous Q6H  . insulin aspart  0-9 Units Subcutaneous Q4H  . insulin aspart  3 Units Subcutaneous Q4H  . mouth rinse  15 mL Mouth Rinse 10 times per day  . midodrine  5 mg Per Tube TID WC  . pantoprazole (PROTONIX) IV  40 mg Intravenous Q24H  . sodium chloride flush  10-40 mL Intracatheter Q12H  . sodium zirconium cyclosilicate  10 g Per Tube Once  . thiamine injection  100 mg Intravenous Daily  . ticagrelor  90 mg Per Tube BID  . zinc sulfate  220 mg Per Tube Daily    Continuous Infusions: . sodium chloride Stopped (08/28/2020 2333)  . fentaNYL infusion INTRAVENOUS 175 mcg/hr (08/23/20 0700)  . midazolam 3 mg/hr (08/23/20 0700)  . norepinephrine (LEVOPHED) Adult  infusion 3 mcg/min (08/23/20 0700)  . vasopressin 0.03 Units/min (08/23/20 0700)    PRN Meds: acetaminophen, ondansetron (ZOFRAN) IV, sodium chloride flush, vecuronium   Vital Signs: BP (!) 98/51   Pulse 65   Temp (!) 100.4 F (38 C)   Resp (!) 30   Ht _0  (1.727 m)   Wt 113.4 kg   SpO2 99%   BMI 38.01 kg/m  SpO2: SpO2: 99 % O2 Device: O2 Device: Ventilator O2 Flow Rate: O2 Flow Rate (L/min): 15 L/min  Intake/output summary:   Intake/Output Summary (Last 24 hours) at 08/23/2020 1258 Last data filed at 08/23/2020 0700 Gross per 24 hour  Intake 1284.77 ml  Output 410 ml  Net 874.77 ml   LBM: Last BM Date:  (PTA) Baseline Weight: Weight: 113.4 kg Most recent weight: Weight: 113.4 kg       Palliative Assessment/Data: PPS 10%    Flowsheet Rows   Flowsheet Row Most Recent Value  Intake Tab   Referral Department Critical care  Unit at Time of Referral ICU  Palliative Care Primary Diagnosis Cardiac  Date Notified 08/22/20  Palliative Care Type New Palliative care  Reason for referral Clarify Goals of Care  Date of Admission 08/16/2020  Date first seen by Palliative Care 08/22/20  # of  days Palliative referral response time 0 Day(s)  # of days IP prior to Palliative referral 1  Clinical Assessment   Palliative Performance Scale Score 10%  Psychosocial & Spiritual Assessment   Palliative Care Outcomes   Patient/Family meeting held? Yes  Who was at the meeting? brother  Palliative Care Outcomes Clarified goals of care, Provided psychosocial or spiritual support      Patient Active Problem List   Diagnosis Date Noted  . STEMI involving left anterior descending coronary artery ([place]) 08/28/2020  . BPH with obstruction/lower urinary tract symptoms 10/05/2015  . Erectile dysfunction of organic origin 10/05/2015  . Hypogonadism in male 10/05/2015    Palliative Care Assessment & Plan   HPI: 60 y.o. male  with past medical history of ETOH abuse, PVD, HTN, DVT, CAD,  COPD, and CKD admitted on 08/20/2020 with cardiac arrest. CPR initiated, received 1 shock and ROSC obtained. Patient required intubation in ED. Pt transported to cath lab for emergent cardiac catheterization. Cardiac cath revealed 100% occluded proximal LAD IRA requiring PCI and stent DES overlapping 2 stent to the proximal to mid LAD. Now requiring vasopressors. Also with AKI. Found to be COVID positive. Found to have mottling of RLE - very high risk for limb loss. PMT consulted to assist with [place].  Assessment: Follow up today with patient's brother Gwyndolyn Saxon. We review patient's condition. Review increased vent support, ongoing need for vasopressors. Reviewed worsening kidney function. Gwyndolyn Saxon expresses understanding. Gwyndolyn Saxon has been able to locate a phone number for patient's son and shares it with me.  Gwyndolyn Saxon asks me about visitation - we discuss that he would be able to visit if patient is at end of life. Gwyndolyn Saxon is unsure if he wants to come - he is 8 hours away. He requests time to consider this.   I called and spoke with patient's son: Colin Rhein "Ajdin Macke at [phone]. Marlou Sa tells me he has not seen Daking since he was 60 years old. He understands the severity of the situation. He is confused as to why he is being involved at all - we discuss legal requirement in [place] for next of kin to serve as decision maker without a designated Media planner - he expresses understanding. He does NOT want to serve as a Media planner for Nat; therefore, patient's brother Gwyndolyn Saxon will continue to serve as Media planner.   Recommendations/Plan:  Patient's brother Gwyndolyn Saxon will serve as Media planner as his adult son has relinquished medical decision making - Gwyndolyn Saxon is next of kin after son  Brother has shared that patient would not want his life prolonged this way - he is considering if he will come to hospital, he is 8 hours away  Code Status:  DNR  Discharge Planning:  To Be Determined  Care plan  was discussed with Dr. Mortimer Fries and CSW, patient's son Marlou Sa and brother Esther Hardy you for allowing the Palliative Medicine Team to assist in the care of this patient.  The above conversation was completed via telephone due to the visitor restrictions during the COVID-19 pandemic. Thorough chart review and discussion with necessary members of the care team was completed as part of assessment. All issues were discussed and addressed but no physical exam was performed.   Total Time 40 minutes Prolonged Time Billed  no       Greater than 50%  of this time was spent counseling and coordinating care related to the above assessment and plan.  Juel Burrow, DNP, AGNP-C Palliative Medicine  Team Team Phone # [phone]  Pager [phone]

## 2020-08-23 NOTE — Progress Notes (Signed)
Patient went from 70s NSR to 120s-130s irregular rhythm. NP made aware and STAT 12 lead obtained. BP stable. Patient repositioned in bed and patient opened eyes briefly but no purposeful movements. Versed placed back at . Will continue to monitor.

## 2020-08-23 NOTE — Progress Notes (Signed)
NAME:  Vincent [name], MRN:  [id number], DOB:  1960/10/05, LOS: 2 ADMISSION DATE:  09/11/2020  61 yo male who presented to [facility] ER via EMS post cardiac arrest on 04/6. Per ER notes EMS reported the pt was seated at a local bar when he suddenly became unresponsive and fell off a barstool. EMS and first responders notified and upon their arrival pt pulseless, CPR started and AED applied. Pt required 1 shock, and ROSC achieved immediately. EMS inserted a King airway in the field. Code STEMI initiated. En route to the ER pt became agitated and delirious, therefore EMS administered versed. However, despite versed the pt ripped his Brooke Dare airway out.  ED course Upon arrival to the ER pt remained altered and agitated with NRB in place and unable to redirect. Therefore, pt required mechanical intubation.EKG suggestive of ST elevation anteriorly.ER physician contacted on call Cardiologist Dr. Juliann Pares and pt transported to cath lab for emergent cardiac catheterization.  Cardiac cath revealed 100% occluded proximal LAD IRA requiring PCI and stent DES overlapping 2 stent to the proximal to mid LAD.Post procedure pt admitted to ICU [facility] team consulted to assist with management.  Pertinent Medical History  Varicose Veins PVD Lymphedema HTN Heartburn DVT CAD COPD CKD FormerETOH Abuse  Significant Hospital Events: Including procedures, antibiotic start and stop dates in addition to other pertinent events   4/6 with anterior STEMI requiring emergent mechanical intubation and cardiac cath   Pt incidentally found to be COVID-19 positive  Remdesivir 04/6>>  Left IJ CVL 04/7>>  4/7 multiorgan failure, severe hypoxia CT HEAD NEG  4/8 severe hypoxia, patient made DNR status     Micro Data:  COVID 19 + INF A/B NEG 4/6 RESP CX pending    Interim History / Subjective:  Remains intubated Severe Hypoxia Vent and vasopressors Life threatening m edical  diseases RT leg mottled Progressive multiorgan failure         Objective   Blood pressure (!) 103/58, pulse 68, temperature (!) 100.58 F (38.1 C), resp. rate (!) 34, height 5\' 8"  (1.727 m), weight 113.4 kg, SpO2 92 %.    Vent Mode: PRVC FiO2 (%):  [70 %-100 %] 85 % Set Rate:  [30 bmp] 30 bmp Vt Set:  [470 mL] 470 mL PEEP:  [14 cmH20] 14 cmH20 Plateau Pressure:  [35 cmH20] 35 cmH20   Intake/Output Summary (Last 24 hours) at 08/23/2020 0754 Last data filed at 08/23/2020 0700 Gross per 24 hour  Intake 2344.97 ml  Output 485 ml  Net 1859.97 ml   Filed Weights   08/22/2020 2057  Weight: 113.4 kg      REVIEW OF SYSTEMS  PATIENT IS UNABLE TO PROVIDE COMPLETE REVIEW OF SYSTEMS DUE TO SEVERE CRITICAL ILLNESS AND TOXIC METABOLIC ENCEPHALOPATHY   PHYSICAL EXAMINATION:  GENERAL:critically ill appearing, +resp distress HEAD: Normocephalic, atraumatic.  EYES: Pupils equal, round, reactive to light.  No scleral icterus.  MOUTH: Moist mucosal membrane. NECK: Supple.intubated PULMONARY: +rhonchi, +wheezing CARDIOVASCULAR: S1 and S2. Regular rate and rhythm. No murmurs, rubs, or gallops.  GASTROINTESTINAL: Soft, nontender, -distended. Positive bowel sounds.  MUSCULOSKELETAL: + edema. Mottled RT leg NEUROLOGIC: obtunded SKIN:intact,warm,dry   Labs/imaging that I havepersonally reviewed  (right click and "Reselect all SmartList Selections" daily)        ASSESSMENT AND PLAN SYNOPSIS   60 yo white male with sudden cardiac arest with acute STEMI 100% LAD occlusion with ischemic cardiomyopathy with COVID 19 infection with severe hypoxic resp failure with progressive renal failure, patient  with progressive multiorgan failure    Severe ACUTE Hypoxic and Hypercapnic Respiratory Failure -continue Mechanical Ventilator support -continue Bronchodilator Therapy -Wean Fio2 and PEEP as tolerated -VAP/VENT bundle implementation Severe hypoxia unable to wean from vent   Vent  Mode: PRVC FiO2 (%):  [70 %-100 %] 85 % Set Rate:  [30 bmp] 30 bmp Vt Set:  [470 mL] 470 mL PEEP:  [14 cmH20] 14 cmH20 Plateau Pressure:  [35 cmH20] 35 cmH20    CARDIAC FAILURE-acute combined systolic/diastolic dysfunction -oxygen as needed -Lasix as tolerated -follow up cardiac enzymes as indicated   CARDIAC ICU monitoring   ACUTE KIDNEY INJURY/Renal Failure -continue Foley Catheter-assess need -Avoid nephrotoxic agents -Follow urine output, BMP -Ensure adequate renal perfusion, optimize oxygenation -Renal dose medications  LIVER FAILURE Follow up CMP  NEUROLOGY Acute toxic metabolic encephalopathy, need for sedation Goal RASS -2 to -3   CARDIOGENIC SHOCK -use vasopressors to keep MAP>65 as needed Check LA  INFECTIOUS DISEASE COVID 19 infection remdesivir stopped due to liver failure -follow up cultures   ENDO - ICU hypoglycemic\Hyperglycemia protocol -check FSBS per protocol   GI GI PROPHYLAXIS as indicated  NUTRITIONAL STATUS DIET-->TF's as tolerated Constipation protocol as indicated   ELECTROLYTES -follow labs as needed -replace as needed -pharmacy consultation and following    Best practice (right click and "Reselect all SmartList Selections" daily)  Diet:  NPO Pain/Anxiety/Delirium protocol (if indicated): Yes (RASS goal -2) VAP protocol (if indicated): Yes DVT prophylaxis: Subcutaneous Heparin GI prophylaxis: PPI Glucose control:  SSI Yes Central venous access:  Yes, and it is still needed Arterial line:  N/A Foley:  Yes, and it is still needed Mobility:  bed rest  PT consulted: N/A Code Status:  full code Disposition: ICU  Labs   CBC: Recent Labs  Lab September 05, 2020 2052 08/22/20 0500 08/22/20 0941 08/23/20 0520  WBC 17.7* 20.7* 18.2* 15.5*  NEUTROABS 8.9* 19.2*  --  13.7*  HGB 12.5* 12.4* 12.9* 11.6*  HCT 39.6 39.0 40.7 36.8*  MCV 96.1 94.2 94.0 94.4  PLT 286 364 336 240    Basic Metabolic Panel: Recent Labs  Lab  2020-09-05 2047 09-05-2020 2052 08/22/20 0500 08/22/20 0941  NA  --  139 138 140  K  --  4.1 5.3* 4.6  CL  --  101 102 103  CO2  --  19* 27 26  GLUCOSE  --  269* 248* 231*  BUN  --  16 22 25*  CREATININE  --  1.63* 2.20* 2.53*  CALCIUM  --  8.6* 7.4* 7.7*  MG  --  2.4 2.3  --   PHOS 7.4*  --  7.4*  --    GFR: Estimated Creatinine Clearance: 37.5 mL/min (A) (by C-G formula based on SCr of 2.53 mg/dL (H)). Recent Labs  Lab 09-05-2020 2052 08/22/20 0500 08/22/20 0941 08/23/20 0520  WBC 17.7* 20.7* 18.2* 15.5*    Liver Function Tests: Recent Labs  Lab 2020-09-05 2052 08/22/20 0500  AST 73* 256*  ALT 59* 288*  ALKPHOS 96 98  BILITOT 0.8 0.8  PROT 6.2* 5.9*  ALBUMIN 2.9* 2.6*   No results for input(s): LIPASE, AMYLASE in the last 168 hours. No results for input(s): AMMONIA in the last 168 hours.  ABG    Component Value Date/Time   PHART 7.16 (LL) 08/22/2020 0538   PCO2ART 72 (HH) 08/22/2020 0538   PO2ART 119 (H) 08/22/2020 0538   HCO3 25.7 08/22/2020 0538   ACIDBASEDEF 4.4 (H) 08/22/2020 0538   O2SAT 97.5  08/22/2020 0538     Coagulation Profile: Recent Labs  Lab 08/23/2020 2052  INR 1.3*    Cardiac Enzymes: No results for input(s): CKTOTAL, CKMB, CKMBINDEX, TROPONINI in the last 168 hours.  HbA1C: Hemoglobin A1C  Date/Time Value Ref Range Status  03/05/2014 11:20 AM 6.2 4.2 - 6.3 % Final    Comment:    The American Diabetes Association recommends that a primary goal of therapy should be <7% and that physicians should reevaluate the treatment regimen in patients with HbA1c values consistently >8%.    Hgb A1c MFr Bld  Date/Time Value Ref Range Status  08/22/2020 09:41 AM 6.4 (H) 4.8 - 5.6 % Final    Comment:    (NOTE) Pre diabetes:          5.7%-6.4%  Diabetes:              >6.4%  Glycemic control for   <7.0% adults with diabetes     CBG: Recent Labs  Lab 08/22/20 1632 08/22/20 1937 08/22/20 2346 08/23/20 0350 08/23/20 0728  GLUCAP 157*  147* 193* 201* 235*    Allergies No Known Allergies     DVT/GI PRX  assessed I Assessed the need for Labs I Assessed the need for Foley I Assessed the need for Central Venous Line Family Discussion when available I Assessed the need for Mobilization I made an Assessment of medications to be adjusted accordingly Safety Risk assessment completed  CASE DISCUSSED IN MULTIDISCIPLINARY ROUNDS WITH ICU TEAM     Critical Care Time devoted to patient care services described in this note is 65 minutes.   Overall, patient is critically ill, prognosis is guarded.  Patient with Multiorgan failure and at high risk for cardiac arrest and death.   Patient remains DNR status, prognosis is very poor  Lucie Leather, M.D.  [facility]  Medical Director [facility] Medical Director [facility] Cardio-Pulmonary Department

## 2020-08-23 NOTE — Progress Notes (Signed)
PHARMACY CONSULT NOTE - FOLLOW UP  Pharmacy Consult for Electrolyte Monitoring and Replacement   Recent Labs: Potassium (mmol/L)  Date Value  08/23/2020 5.8 (H)  03/03/2014 4.1   Magnesium (mg/dL)  Date Value  65/99/3570 2.3   Calcium (mg/dL)  Date Value  17/79/3903 7.4 (L)   Calcium, Total (mg/dL)  Date Value  00/92/3300 8.0 (L)   Albumin (g/dL)  Date Value  76/22/6333 2.7 (L)   Phosphorus (mg/dL)  Date Value  54/56/2563 9.5 (H)   Sodium (mmol/L)  Date Value  08/23/2020 137  03/03/2014 138   Corrected Ca: 8.44  Assessment: Patient is a 60 y/o M who was BIBEMS on the evening of 4/6 as code STEMI and was emergently taken to cath lab with findings of completed occluded proximal LAD lesion which was treated with DES x 2. Pharmacy has been consulted to monitor and replace electrolytes.   Goal of Therapy:  K 4.0-5.1 Mg 2.0-2.4 All other electrolytes WNL  Plan:  --K 5.8 - Lokelma 10 g per tube x 1 dose ordered  --Repeat K @ 1400: 6.0 - ordered Novolog 10 units and 1 ampule D50 x 1  ----Repeat K level ordered for 1800 --No electrolyte replacement needed at this time  --Monitor electrolytes with AM labs   Reatha Armour, PharmD Pharmacy Resident  08/23/2020 11:58 AM

## 2020-08-24 LAB — C-REACTIVE PROTEIN: CRP: 14 mg/dL — ABNORMAL HIGH (ref <1.0)

## 2020-08-24 LAB — MAGNESIUM
Magnesium: 2.5 mg/dL — ABNORMAL HIGH (ref 1.7–2.4)
Magnesium: 2.8 mg/dL — ABNORMAL HIGH (ref 1.7–2.4)

## 2020-08-24 LAB — COMPREHENSIVE METABOLIC PANEL
ALT: 1193 U/L — ABNORMAL HIGH (ref 0–44)
AST: 473 U/L — ABNORMAL HIGH (ref 15–41)
Albumin: 2.6 g/dL — ABNORMAL LOW (ref 3.5–5.0)
Alkaline Phosphatase: 86 U/L (ref 38–126)
Anion gap: 13 (ref 5–15)
BUN: 80 mg/dL — ABNORMAL HIGH (ref 8–23)
CO2: 24 mmol/L (ref 22–32)
Calcium: 6.9 mg/dL — ABNORMAL LOW (ref 8.9–10.3)
Chloride: 100 mmol/L (ref 98–111)
Creatinine, Ser: 7.02 mg/dL — ABNORMAL HIGH (ref 0.61–1.24)
GFR, Estimated: 8 mL/min — ABNORMAL LOW (ref >60)
Glucose, Bld: 166 mg/dL — ABNORMAL HIGH (ref 70–99)
Potassium: 5.8 mmol/L — ABNORMAL HIGH (ref 3.5–5.1)
Sodium: 137 mmol/L (ref 135–145)
Total Bilirubin: 0.8 mg/dL (ref 0.3–1.2)
Total Protein: 5.6 g/dL — ABNORMAL LOW (ref 6.5–8.1)

## 2020-08-24 LAB — CBC WITH DIFFERENTIAL/PLATELET
Abs Immature Granulocytes: 0.1 10*3/uL — ABNORMAL HIGH (ref 0.00–0.07)
Basophils Absolute: 0 10*3/uL (ref 0.0–0.1)
Basophils Relative: 0 %
Eosinophils Absolute: 0 10*3/uL (ref 0.0–0.5)
Eosinophils Relative: 0 %
HCT: 33.5 % — ABNORMAL LOW (ref 39.0–52.0)
Hemoglobin: 10.8 g/dL — ABNORMAL LOW (ref 13.0–17.0)
Immature Granulocytes: 1 %
Lymphocytes Relative: 4 %
Lymphs Abs: 0.6 10*3/uL — ABNORMAL LOW (ref 0.7–4.0)
MCH: 29.9 pg (ref 26.0–34.0)
MCHC: 32.2 g/dL (ref 30.0–36.0)
MCV: 92.8 fL (ref 80.0–100.0)
Monocytes Absolute: 0.6 10*3/uL (ref 0.1–1.0)
Monocytes Relative: 5 %
Neutro Abs: 12.2 10*3/uL — ABNORMAL HIGH (ref 1.7–7.7)
Neutrophils Relative %: 90 %
Platelets: 193 10*3/uL (ref 150–400)
RBC: 3.61 MIL/uL — ABNORMAL LOW (ref 4.22–5.81)
RDW: 13.3 % (ref 11.5–15.5)
WBC: 13.5 10*3/uL — ABNORMAL HIGH (ref 4.0–10.5)
nRBC: 0.1 % (ref 0.0–0.2)

## 2020-08-24 LAB — POTASSIUM: Potassium: 6.2 mmol/L — ABNORMAL HIGH (ref 3.5–5.1)

## 2020-08-24 LAB — GLUCOSE, CAPILLARY
Glucose-Capillary: 138 mg/dL — ABNORMAL HIGH (ref 70–99)
Glucose-Capillary: 154 mg/dL — ABNORMAL HIGH (ref 70–99)
Glucose-Capillary: 153 mg/dL — ABNORMAL HIGH (ref 70–99)
Glucose-Capillary: 168 mg/dL — ABNORMAL HIGH (ref 70–99)
Glucose-Capillary: 172 mg/dL — ABNORMAL HIGH (ref 70–99)
Glucose-Capillary: 214 mg/dL — ABNORMAL HIGH (ref 70–99)
Glucose-Capillary: 199 mg/dL — ABNORMAL HIGH (ref 70–99)

## 2020-08-24 LAB — CULTURE, RESPIRATORY W GRAM STAIN: Culture: NORMAL

## 2020-08-24 LAB — BASIC METABOLIC PANEL
Anion gap: 12 (ref 5–15)
BUN: 74 mg/dL — ABNORMAL HIGH (ref 8–23)
CO2: 26 mmol/L (ref 22–32)
Calcium: 7.1 mg/dL — ABNORMAL LOW (ref 8.9–10.3)
Chloride: 101 mmol/L (ref 98–111)
Creatinine, Ser: 6.36 mg/dL — ABNORMAL HIGH (ref 0.61–1.24)
GFR, Estimated: 9 mL/min — ABNORMAL LOW (ref >60)
Glucose, Bld: 136 mg/dL — ABNORMAL HIGH (ref 70–99)
Potassium: 5.6 mmol/L — ABNORMAL HIGH (ref 3.5–5.1)
Sodium: 139 mmol/L (ref 135–145)

## 2020-08-24 LAB — HEPARIN LEVEL (UNFRACTIONATED)
Heparin Unfractionated: 0.1 IU/mL — ABNORMAL LOW (ref 0.30–0.70)
Heparin Unfractionated: 0.41 IU/mL (ref 0.30–0.70)
Heparin Unfractionated: 0.43 IU/mL (ref 0.30–0.70)

## 2020-08-24 LAB — PHOSPHORUS: Phosphorus: 12 mg/dL — ABNORMAL HIGH (ref 2.5–4.6)

## 2020-08-24 LAB — D-DIMER, QUANTITATIVE: D-Dimer, Quant: 17.21 ug/mL-FEU — ABNORMAL HIGH (ref 0.00–0.50)

## 2020-08-24 LAB — FERRITIN: Ferritin: 2767 ng/mL — ABNORMAL HIGH (ref 24–336)

## 2020-08-24 MED ORDER — AMIODARONE LOAD VIA INFUSION
150.0000 mg | Freq: Once | INTRAVENOUS | Status: DC
  Filled 2020-08-24 (×2): qty 83.34

## 2020-08-24 MED ORDER — HEPARIN BOLUS VIA INFUSION
2800.0000 [IU] | Freq: Once | INTRAVENOUS | Status: AC
  Administered 2020-08-24: 2800 [IU] via INTRAVENOUS
  Filled 2020-08-24: qty 2800

## 2020-08-24 MED ORDER — DEXTROSE 50 % IV SOLN
1.0000 | Freq: Once | INTRAVENOUS | Status: AC
  Administered 2020-08-24: 50 mL via INTRAVENOUS
  Filled 2020-08-24: qty 50

## 2020-08-24 MED ORDER — INSULIN ASPART 100 UNIT/ML IV SOLN
10.0000 [IU] | Freq: Once | INTRAVENOUS | Status: AC
  Administered 2020-08-24: 10 [IU] via INTRAVENOUS
  Filled 2020-08-24: qty 0.1

## 2020-08-24 MED ORDER — ACETAMINOPHEN 325 MG PO TABS
650.0000 mg | ORAL_TABLET | ORAL | Status: DC | PRN
  Filled 2020-08-24: qty 2

## 2020-08-24 MED ORDER — HEPARIN (PORCINE) 25000 UT/250ML-% IV SOLN
1600.0000 [IU]/h | INTRAVENOUS | Status: DC
  Administered 2020-08-24 – 2020-08-26 (×3): 1600 [IU]/h via INTRAVENOUS
  Filled 2020-08-24 (×3): qty 250

## 2020-08-24 MED ORDER — AMIODARONE HCL IN DEXTROSE 360-4.14 MG/200ML-% IV SOLN
60.0000 mg/h | INTRAVENOUS | Status: DC
  Administered 2020-08-24: 60 mg/h via INTRAVENOUS
  Filled 2020-08-24: qty 200

## 2020-08-24 MED ORDER — ACETAMINOPHEN 650 MG RE SUPP
650.0000 mg | RECTAL | Status: DC | PRN
  Administered 2020-08-24: 650 mg via RECTAL
  Filled 2020-08-24: qty 1

## 2020-08-24 MED ORDER — AMIODARONE IV BOLUS ONLY 150 MG/100ML
INTRAVENOUS | Status: AC
  Administered 2020-08-24: 150 mg via INTRAVENOUS
  Filled 2020-08-24: qty 100

## 2020-08-24 MED ORDER — AMIODARONE HCL IN DEXTROSE 360-4.14 MG/200ML-% IV SOLN
30.0000 mg/h | INTRAVENOUS | Status: DC
  Administered 2020-08-24 – 2020-08-25 (×4): 30 mg/h via INTRAVENOUS
  Filled 2020-08-24 (×4): qty 200

## 2020-08-24 MED ORDER — SODIUM BICARBONATE 8.4 % IV SOLN
50.0000 meq | Freq: Once | INTRAVENOUS | Status: AC
  Administered 2020-08-24: 50 meq via INTRAVENOUS
  Filled 2020-08-24: qty 50

## 2020-08-24 MED ORDER — VITAL AF 1.2 CAL PO LIQD
1000.0000 mL | ORAL | Status: DC
  Administered 2020-08-24 – 2020-08-25 (×2): 1000 mL

## 2020-08-24 MED ORDER — SODIUM ZIRCONIUM CYCLOSILICATE 5 G PO PACK
10.0000 g | PACK | Freq: Every day | ORAL | Status: DC
  Administered 2020-08-25: 10 g
  Filled 2020-08-24: qty 2

## 2020-08-24 MED ORDER — CALCIUM GLUCONATE-NACL 1-0.675 GM/50ML-% IV SOLN
1.0000 g | Freq: Once | INTRAVENOUS | Status: AC
  Administered 2020-08-24: 1000 mg via INTRAVENOUS
  Filled 2020-08-24: qty 50

## 2020-08-24 NOTE — Progress Notes (Signed)
NAME:  Vincent [name], MRN:  [id number], DOB:  January 30, 1961, LOS: 3 ADMISSION DATE:  August 28, 2020 60 yo male who presented to [facility] ER via EMS post cardiac arrest on 04/6. Per ER notes EMS reported the pt was seated at a local bar when he suddenly became unresponsive and fell off a barstool. EMS and first responders notified and upon their arrival pt pulseless, CPR started and AED applied. Pt required 1 shock, and ROSC achieved immediately. EMS inserted a King airway in the field. Code STEMI initiated. En route to the ER pt became agitated and delirious, therefore EMS administered versed. However, despite versed the pt ripped his Brooke Dare airway out.  ED course Upon arrival to the ER pt remained altered and agitated with NRB in place and unable to redirect. Therefore, pt required mechanical intubation.EKG suggestive of ST elevation anteriorly.ER physician contacted on call Cardiologist Dr. Juliann Pares and pt transported to cath lab for emergent cardiac catheterization.  Cardiac cath revealed 100% occluded proximal LAD IRA requiring PCI and stent DES overlapping 2 stent to the proximal to mid LAD.Post procedure pt admitted to ICU [facility] team consulted to assist with management.  Pertinent Medical History  Varicose Veins PVD Lymphedema HTN Heartburn DVT CAD COPD CKD FormerETOH Abuse  Significant Hospital Events: Including procedures, antibiotic start and stop dates in addition to other pertinent events   4/6 with anterior STEMI requiring emergent mechanical intubation and cardiac cath   Pt incidentally found to be COVID-19 positive  Remdesivir 04/6>>  Left IJ CVL 04/7>>  4/7 multiorgan failure, severe hypoxia CT HEAD NEG  4/8 severe hypoxia, patient made DNR status  4/9 severe hypoxia, vent support, vasopressors, acute afib with RVRF     Micro Data:  COVID 19 + INF A/B NEG 4/6 RESP CX pending      Interim History / Subjective:  Remains  intubated Severe hypoxia multiple pressors Patient with life threatening multiorgan failure +vasculopathy Acute afiob with R VR today-started hep/amio per cardiology      Objective   Blood pressure (!) 99/50, pulse (!) 109, temperature (!) 100.9 F (38.3 C), resp. rate (!) 30, height 5\' 8"  (1.727 m), weight 98.3 kg, SpO2 91 %.    Vent Mode: PRVC FiO2 (%):  [60 %-85 %] 60 % Set Rate:  [30 bmp] 30 bmp Vt Set:  [470 mL] 470 mL PEEP:  [14 cmH20] 14 cmH20 Plateau Pressure:  [35 cmH20] 35 cmH20   Intake/Output Summary (Last 24 hours) at 08/24/2020 0616 Last data filed at 08/24/2020 0600 Gross per 24 hour  Intake 2374.31 ml  Output 780 ml  Net 1594.31 ml   Filed Weights   28-Aug-2020 2057 08/24/20 0445  Weight: 113.4 kg 98.3 kg      REVIEW OF SYSTEMS  PATIENT IS UNABLE TO PROVIDE COMPLETE REVIEW OF SYSTEMS DUE TO SEVERE CRITICAL ILLNESS AND TOXIC METABOLIC ENCEPHALOPATHY   PHYSICAL EXAMINATION:  GENERAL:critically ill appearing, +resp distress HEAD: Normocephalic, atraumatic.  EYES: Pupils equal, round, reactive to light.  No scleral icterus.  MOUTH: Moist mucosal membrane. NECK: Supple. PULMONARY: +rhonchi, +wheezing CARDIOVASCULAR: S1 and S2. Regular rate and rhythm. No murmurs, rubs, or gallops.  GASTROINTESTINAL: Soft, nontender, -distended. Positive bowel sounds.  MUSCULOSKELETAL:+ edema. Mottled RT leg NEUROLOGIC: obtunded SKIN:intact,warm,dry   Labs/imaging that I havepersonally reviewed  (right click and "Reselect all SmartList Selections" daily)      ASSESSMENT AND PLAN SYNOPSIS  60 yo white male with sudden cardiac arest with acute STEMI 100% LAD occlusion with ischemic cardiomyopathy  with COVID 19 infection with severe hypoxic resp failure with progressive renal failure, patient with progressive multiorgan failure  Severe ACUTE Hypoxic and Hypercapnic Respiratory Failure -continue Mechanical Ventilator support -continue Bronchodilator Therapy -Wean  Fio2 and PEEP as tolerated -VAP/VENT bundle implementation Failure to wean from vent due to severe hypoxia  Vent Mode: PRVC FiO2 (%):  [60 %-85 %] 60 % Set Rate:  [30 bmp] 30 bmp Vt Set:  [470 mL] 470 mL PEEP:  [14 cmH20] 14 cmH20 Plateau Pressure:  [35 cmH20] 35 cmH20     CARDIAC FAILURE-acute combined systolic/diastolic dysfunction Acute STEMI s/p stent complicated by acute afib with RVF-started hep/amio per cardiology -oxygen as needed -follow up cardiac enzymes as indicated   CARDIAC ICU monitoring   ACUTE KIDNEY INJURY/Renal Failure -continue Foley Catheter-assess need -Avoid nephrotoxic agents -Follow urine output, BMP -Ensure adequate renal perfusion, optimize oxygenation -Renal dose medications  LIVER FAILURE FROM CARDIOGENIC SHOCK POOR PROGNOSIS  NEUROLOGY Acute toxic metabolic encephalopathy, need for sedation Goal RASS -2 to -3   CARDIOGENIC  SHOCK -use vasopressors to keep MAP>65 as needed -follow ABG and LA   ENDO - ICU hypoglycemic\Hyperglycemia protocol -check FSBS per protocol   GI GI PROPHYLAXIS as indicated  NUTRITIONAL STATUS DIET-->TF's as tolerated Constipation protocol as indicated   ELECTROLYTES -follow labs as needed -replace as needed -pharmacy consultation and following   INFECTIOUS DISEASE COVID 19 infection remdesivir stopped due to liver failure -follow up cultures   Best practice (right click and "Reselect all SmartList Selections" daily)  Diet:NPO Pain/Anxiety/Delirium protocol (if indicated):Yes(RASS goal -2) VAP protocol (if indicated):Yes DVT prophylaxis:Subcutaneous Heparin GI prophylaxis:PPI Glucose control:SSIYes Central venous access:Yes, and it is still needed Arterial line:N/A Foley:Yes, and it is still needed Mobility:bed rest PT consulted:N/A Code Status:full code Disposition:ICU    Labs   CBC: Recent Labs  Lab 08/17/2020 2052 08/22/20 0500 08/22/20 0941  08/23/20 0520 08/24/20 0527  WBC 17.7* 20.7* 18.2* 15.5* 13.5*  NEUTROABS 8.9* 19.2*  --  13.7* 12.2*  HGB 12.5* 12.4* 12.9* 11.6* 10.8*  HCT 39.6 39.0 40.7 36.8* 33.5*  MCV 96.1 94.2 94.0 94.4 92.8  PLT 286 364 336 240 193    Basic Metabolic Panel: Recent Labs  Lab 09/08/2020 2047 08/22/2020 2052 08/27/2020 2052 08/22/20 0500 08/22/20 0941 08/23/20 0520 08/23/20 1410 08/23/20 1816 08/23/20 2323  NA  --  139  --  138 140 137  --   --  139  K  --  4.1   < > 5.3* 4.6 5.8* 6.0* 5.3* 5.6*  CL  --  101  --  102 103 101  --   --  101  CO2  --  19*  --  27 26 24   --   --  26  GLUCOSE  --  269*  --  248* 231* 243*  --   --  136*  BUN  --  16  --  22 25* 46*  --   --  74*  CREATININE  --  1.63*  --  2.20* 2.53* 4.20*  --   --  6.36*  CALCIUM  --  8.6*  --  7.4* 7.7* 7.4*  --   --  7.1*  MG  --  2.4  --  2.3  --  2.3  --   --  2.5*  PHOS 7.4*  --   --  7.4*  --  9.5*  --   --   --    < > = values in this interval  not displayed.   GFR: Estimated Creatinine Clearance: 13.9 mL/min (A) (by C-G formula based on SCr of 6.36 mg/dL (H)). Recent Labs  Lab 08/22/20 0500 08/22/20 0941 08/23/20 0520 08/23/20 1410 08/24/20 0527  WBC 20.7* 18.2* 15.5*  --  13.5*  LATICACIDVEN  --   --   --  1.4  --     Liver Function Tests: Recent Labs  Lab 08/30/2020 2052 08/22/20 0500 08/23/20 0520  AST 73* 256* 1,532*  ALT 59* 288* 1,683*  ALKPHOS 96 98 94  BILITOT 0.8 0.8 0.5  PROT 6.2* 5.9* 5.9*  ALBUMIN 2.9* 2.6* 2.7*   No results for input(s): LIPASE, AMYLASE in the last 168 hours. No results for input(s): AMMONIA in the last 168 hours.  ABG    Component Value Date/Time   PHART 7.16 (LL) 08/22/2020 0538   PCO2ART 72 (HH) 08/22/2020 0538   PO2ART 119 (H) 08/22/2020 0538   HCO3 25.7 08/22/2020 0538   ACIDBASEDEF 4.4 (H) 08/22/2020 0538   O2SAT 97.5 08/22/2020 0538     Coagulation Profile: Recent Labs  Lab 08/29/2020 2052  INR 1.3*    Cardiac Enzymes: No results for input(s):  CKTOTAL, CKMB, CKMBINDEX, TROPONINI in the last 168 hours.  HbA1C: Hemoglobin A1C  Date/Time Value Ref Range Status  03/05/2014 11:20 AM 6.2 4.2 - 6.3 % Final    Comment:    The American Diabetes Association recommends that a primary goal of therapy should be <7% and that physicians should reevaluate the treatment regimen in patients with HbA1c values consistently >8%.    Hgb A1c MFr Bld  Date/Time Value Ref Range Status  08/22/2020 09:41 AM 6.4 (H) 4.8 - 5.6 % Final    Comment:    (NOTE) Pre diabetes:          5.7%-6.4%  Diabetes:              >6.4%  Glycemic control for   <7.0% adults with diabetes     CBG: Recent Labs  Lab 08/23/20 1517 08/23/20 1928 08/23/20 2330 08/24/20 0133 08/24/20 0315  GLUCAP 198* 130* 133* 138* 154*    Allergies No Known Allergies     DVT/GI PRX  assessed I Assessed the need for Labs I Assessed the need for Foley I Assessed the need for Central Venous Line Family Discussion when available I Assessed the need for Mobilization I made an Assessment of medications to be adjusted accordingly Safety Risk assessment completed  CASE DISCUSSED IN MULTIDISCIPLINARY ROUNDS WITH ICU TEAM     Critical Care Time devoted to patient care services described in this note is 65 minutes.   Overall, patient is critically ill, prognosis is guarded.  Patient with Multiorgan failure and at high risk for cardiac arrest and death.   Patient remains DNR status, prognosis is very poor The son has relinquished all medical decision rights to the brother of patient  Sigrid Schwebach Santiago Glad, M.D.  [facility]  Medical Director [facility] Medical Director [facility] Cardio-Pulmonary Department

## 2020-08-24 NOTE — Progress Notes (Signed)
Patient Name: Vincent [name] Date of Encounter: 08/24/2020  Hospital Problem List     Active Problems:   STEMI involving left anterior descending coronary artery [facility])    Patient Profile     60 yo male who presented to [facility] ER via EMS post cardiac arrest on 04/6. Per ER notes EMS reported the pt was seated at a local bar when he suddenly became unresponsive and fell off a barstool. EMS and first responders notified and upon their arrival pt pulseless, CPR started and AED applied. Pt required 1 shock, and ROSC achieved immediately. EMS inserted a King airway in the field. Code STEMI initiated. En route to the ER pt became agitated and delirious, therefore EMS administered versed. However, despite versed the pt ripped his Edison Pace airway out .   Upon arrival to the ER pt remained altered and agitated with NRB in place and unable to redirect. Therefore, pt required mechanical intubation.EKG suggestive of ST elevation anteriorly.ER physician contacted on call Cardiologist Dr. Clayborn Bigness and pt transported to cath lab for emergent cardiac catheterization.  Cardiac cath revealed 100% occluded proximal LAD IRA requiring PCI and stent DES overlapping 2 stent to the proximal to mid LAD.Post procedure pt admitted to ICU [facility] team consulted to assist with management. Found to be covid positive and received remdesivir.   Subjective   Remains intubated   Inpatient Medications    . amiodarone  150 mg Intravenous Once  . vitamin C  500 mg Per Tube Daily  . aspirin  81 mg Per Tube Daily  . chlorhexidine gluconate (MEDLINE KIT)  15 mL Mouth Rinse BID  . Chlorhexidine Gluconate Cloth  6 each Topical Daily  . docusate  100 mg Per Tube BID  . feeding supplement (PROSource TF)  45 mL Per Tube QID  . [START ON 08/25/2020] feeding supplement (VITAL AF 1.2 CAL)  1,000 mL Per Tube Q24H  . fentaNYL (SUBLIMAZE) injection  100 mcg Intravenous Once  . folic acid  1 mg Intravenous Daily  . free water   30 mL Per Tube Q4H  . hydrocortisone sod succinate (SOLU-CORTEF) inj  50 mg Intravenous Q6H  . insulin aspart  0-9 Units Subcutaneous Q4H  . insulin aspart  3 Units Subcutaneous Q4H  . mouth rinse  15 mL Mouth Rinse 10 times per day  . midodrine  5 mg Per Tube TID WC  . pantoprazole (PROTONIX) IV  40 mg Intravenous Q24H  . polyethylene glycol  17 g Per Tube Daily  . sodium chloride flush  10-40 mL Intracatheter Q12H  . sodium zirconium cyclosilicate  10 g Per Tube Q6H  . thiamine injection  100 mg Intravenous Daily  . ticagrelor  90 mg Per Tube BID  . zinc sulfate  220 mg Per Tube Daily    Vital Signs    Vitals:   08/24/20 1200 08/24/20 1300 08/24/20 1400 08/24/20 1500  BP: (!) 106/55 (!) 111/59 (!) 92/55 (!) 108/54  Pulse: 62 (!) 106 82 (!) 46  Resp: (!) 30 (!) 30 (!) 30 (!) 30  Temp: 100.22 F (37.9 C) 99.86 F (37.7 C) 99.68 F (37.6 C) 99.5 F (37.5 C)  TempSrc:      SpO2: 93% 92% 94% 93%  Weight:      Height:        Intake/Output Summary (Last 24 hours) at 08/24/2020 1635 Last data filed at 08/24/2020 1519 Gross per 24 hour  Intake 2990.74 ml  Output 615 ml  Net 2375.74  ml   Filed Weights   08/23/2020 2057 08/24/20 0445  Weight: 113.4 kg 98.3 kg    Physical Exam    ONG:EXBMWUX ill male HEENT: normal.  Neck: Supple, no JVD, carotid bruits, or masses. Cardiac: RRR, at present.  Respiratory:  Respirations regular and unlabored, clear to auscultation bilaterally. GI: Soft, nontender, nondistended, BS + x 4. MS: no deformity or atrophy. Skin: warm and dry, no rash. Neuro:  intubated  Labs    CBC Recent Labs    08/23/20 0520 08/24/20 0527  WBC 15.5* 13.5*  NEUTROABS 13.7* 12.2*  HGB 11.6* 10.8*  HCT 36.8* 33.5*  MCV 94.4 92.8  PLT 240 324   Basic Metabolic Panel Recent Labs    08/23/20 0520 08/23/20 1410 08/23/20 2323 08/24/20 0527  NA 137  --  139 137  K 5.8*   < > 5.6* 5.8*  CL 101  --  101 100  CO2 24  --  26 24  GLUCOSE 243*  --  136*  166*  BUN 46*  --  74* 80*  CREATININE 4.20*  --  6.36* 7.02*  CALCIUM 7.4*  --  7.1* 6.9*  MG 2.3  --  2.5* 2.8*  PHOS 9.5*  --   --  12.0*   < > = values in this interval not displayed.   Liver Function Tests Recent Labs    08/23/20 0520 08/24/20 0527  AST 1,532* 473*  ALT 1,683* 1,193*  ALKPHOS 94 86  BILITOT 0.5 0.8  PROT 5.9* 5.6*  ALBUMIN 2.7* 2.6*   No results for input(s): LIPASE, AMYLASE in the last 72 hours. Cardiac Enzymes No results for input(s): CKTOTAL, CKMB, CKMBINDEX, TROPONINI in the last 72 hours. BNP Recent Labs    09/07/2020 2052  BNP 1,790.7*   D-Dimer Recent Labs    08/23/20 0520 08/24/20 0527  DDIMER 18.34* 17.21*   Hemoglobin A1C Recent Labs    08/22/20 0941  HGBA1C 6.4*   Fasting Lipid Panel No results for input(s): CHOL, HDL, LDLCALC, TRIG, CHOLHDL, LDLDIRECT in the last 72 hours. Thyroid Function Tests No results for input(s): TSH, T4TOTAL, T3FREE, THYROIDAB in the last 72 hours.  Invalid input(s): FREET3  Telemetry    afib with rvr convertd to nsr  ECG       Radiology    DG Abd 1 View  Result Date: 08/22/2020 CLINICAL DATA:  Orogastric tube placement. EXAM: ABDOMEN - 1 VIEW COMPARISON:  Chest x-ray 08/22/2020. FINDINGS: Orogastric tube noted with tip over the stomach. Mild gastric distention. No bowel distention. No free air. Contrast noted in the kidneys. Degenerative changes and scoliosis thoracic spine. IMPRESSION: Orogastric tube noted with tip over the stomach. Mild gastric distention. Electronically Signed   By: Marcello Moores  Register   On: 08/22/2020 11:43   CT HEAD WO CONTRAST  Result Date: 08/22/2020 CLINICAL DATA:  60 year old male status post cardiac arrest, STEMI. EXAM: CT HEAD WITHOUT CONTRAST TECHNIQUE: Contiguous axial images were obtained from the base of the skull through the vertex without intravenous contrast. COMPARISON:  Brain MRI 08/19/2016. FINDINGS: Brain: No midline shift, mass effect, or evidence of  intracranial mass lesion. No ventriculomegaly. No acute intracranial hemorrhage identified. No cortically based acute infarct identified. Gray-white matter differentiation appears symmetric and within normal limits throughout the brain. Normal basilar cisterns. Vascular: Extensive Calcified atherosclerosis at the skull base. Intravascular contrast appears to be present. No suspicious intracranial vascular hyperdensity. Skull: No acute osseous abnormality identified. Sinuses/Orbits: Scattered sinus fluid, mucosal thickening and opacification. Fluid  in the nasal cavity and nasopharynx. Intubated. Tympanic cavities and mastoids remain well aerated. Other: Generalized scalp soft tissue edema. Right anterior vertex broad-based scalp hematoma. Incidental small benign left vertex scalp lipoma. IMPRESSION: 1. Intravascular contrast suspected. Normal CT appearance of the brain. 2. Generalized scalp soft tissue edema and right vertex scalp hematoma. 3. Intubated.  Fluid in the pharynx, paranasal sinuses. Electronically Signed   By: Genevie Ann M.D.   On: 08/22/2020 06:19   CARDIAC CATHETERIZATION  Result Date: 08/22/2020  Prox RCA to Mid RCA lesion is 70% stenosed.  Prox Cx lesion is 25% stenosed.  Prox LAD lesion is 100% stenosed.  A drug-eluting stent was successfully placed using a STENT RESOLUTE ONYX 2.5X30.  Post intervention, there is a 0% residual stenosis.  Mid LAD lesion is 75% stenosed.  A drug-eluting stent was successfully placed using a STENT RESOLUTE ONYX 2.25X30.  Post intervention, there is a 0% residual stenosis.  Conclusion conclusion STEMI presentation with sudden death respiratory failure intubated sedated severely depressed left ventricular function globally ejection fraction less than 25%  Conclusion STEMI presentation anterior STEMI Complicated by sudden death respiratory failure intubated sedated Diagnostic procedure Severely depressed left ventricular function of less than 25% globally with  moderate left ventricular enlargement Coronaries Left main large free of disease LAD large with 100% proximal occlusion IRA TIMI 0 flow Circumflex large with minor irregularities RCA large with moderate to severe mid disease TIMI-3 flow Intervention Successful PCI and stent of proximal and mid LAD with overlapping stents DES resolute Onyx Proximal 2.5 x 30 mm mid 2.25 x 30 mm Lesion reduced from 100% down to 0 proximally and 75% down to 0 mid TIMI 0 was transformed to TIMI-3 flow by the end of the case Minx was deployed in the left femoral artery Cangrelor infusion for 2 hours Angiomax at reduced rate for an additional 4 hours Aspirin 325 and Brilinta 180 down the NG Amiodarone IV post arrest load and infusion Patient was maintained on the vent intubated sedated Patient was transported to intensive care for critical care management No complications  US ARTERIAL LOWER EXTREMITY DUPLEX RIGHT(NON-ABI)  Result Date: 08/22/2020 CLINICAL DATA:  60 year old male with cyanosis EXAM: UNILATERAL LOWER EXTREMITY ARTERIAL DUPLEX SCAN TECHNIQUE: Gray-scale sonography as well as color Doppler and duplex ultrasound was performed to evaluate the arteries of SINGLE lower EXTREMITY including the common, superficial and profunda femoral arteries, popliteal artery and calf arteries. COMPARISON:  None. FINDINGS: Right Lower Extremity Directed duplex demonstrates monophasic waveform of common femoral artery, profunda femoris. SFA is occluded to the popliteal artery. Reconstitution of the popliteal artery with monophasic waveform. Monophasic tibial arteries. Left Lower Extremity Directed duplex left lower extremity demonstrates monophasic common femoral artery IMPRESSION: Directed duplex right lower extremity demonstrates monophasic common femoral artery, occlusion of the SFA, and reconstitution of the popliteal artery. Monophasic waveform of the tibial arteries. Signed, Dulcy Fanny. Dellia Nims, RPVI Vascular and Interventional Radiology  [facility] Radiology Electronically Signed   By: Corrie Mckusick D.O.   On: 08/22/2020 14:34   DG Chest Port 1 View  Result Date: 08/22/2020 CLINICAL DATA:  Central line placement EXAM: PORTABLE CHEST 1 VIEW COMPARISON:  09/11/2020 FINDINGS: Left central line is in place with the tip in the upper SVC. No pneumothorax. Endotracheal tube and NG tube are unchanged. Diffuse bilateral airspace disease, worsening on the left since prior study. IMPRESSION: Left central line tip in the upper SVC.  No pneumothorax. Diffuse bilateral airspace disease, worsening on the left  since prior study Electronically Signed   By: Rolm Baptise M.D.   On: 08/22/2020 01:03   DG Chest Portable 1 View  Result Date: 08/19/2020 CLINICAL DATA:  Status post intubation.  Field STEMI. EXAM: PORTABLE CHEST 1 VIEW COMPARISON:  Chest x-ray 03/05/2014 FINDINGS: Endotracheal tube with tip approximately 3 cm above the carina. Enteric tube coursing below the hemidiaphragm with tip collimated off view and side port overlying the gastric lumen. The heart size and mediastinal contours are unchanged. Aortic arch calcifications. No focal consolidation. Increased interstitial markings. Right costophrenic angle collimated off view. No pleural effusion. No pneumothorax. No acute osseous abnormality. IMPRESSION: Endotracheal tube and enteric tube in grossly appropriate position. Pulmonary edema. Electronically Signed   By: Iven Finn M.D.   On: 09/12/2020 21:14   ECHOCARDIOGRAM COMPLETE  Result Date: 08/22/2020    ECHOCARDIOGRAM REPORT   Patient Name:   DARDAN SHELTON Date of Exam: 08/22/2020 Medical Rec #:  [id number]       Height:       68.0 in Accession #:    [id number]      Weight:       250.0 lb Date of Birth:  1961-05-11       BSA:          2.247 m Patient Age:    67 years        BP:           102/58 mmHg Patient Gender: M               HR:           62 bpm. Exam Location:  [facility] Procedure: 2D Echo, Color Doppler, Cardiac Doppler and  Intracardiac            Opacification Agent Indications:     I21.9 Myocardial Infarct  History:         Patient has no prior history of Echocardiogram examinations.                  CAD, COPD and CKD; Risk Factors:Hypertension. Pt tested                  positive for COVID-19 on 09/07/2020.  Sonographer:     Charmayne Sheer RDCS (AE) Referring Phys:  [id number] [facility] D CALLWOOD Diagnosing Phys: Bartholome Bill MD  Sonographer Comments: Suboptimal parasternal window and echo performed with patient supine and on artificial respirator. Image acquisition challenging due to COPD. Global longitudinal strain was attempted. IMPRESSIONS  1. Probable small apical thrombus. Left ventricular ejection fraction, by estimation, is 20 to 25%. The left ventricle has severely decreased function. The left ventricle demonstrates regional wall motion abnormalities (see scoring diagram/findings for description). The left ventricular internal cavity size was mildly dilated. Left ventricular diastolic parameters were normal.  2. Right ventricular systolic function is normal. The right ventricular size is normal.  3. The mitral valve is grossly normal. Mild mitral valve regurgitation.  4. The aortic valve is grossly normal. Aortic valve regurgitation is not visualized. FINDINGS  Left Ventricle: Probable small apical thrombus. Left ventricular ejection fraction, by estimation, is 20 to 25%. The left ventricle has severely decreased function. The left ventricle demonstrates regional wall motion abnormalities. Definity contrast agent was given IV to delineate the left ventricular endocardial borders. The left ventricular internal cavity size was mildly dilated. There is borderline left ventricular hypertrophy. Left ventricular diastolic parameters were normal.  LV Wall Scoring: The apex is akinetic. The entire anterior wall,  mid and distal lateral wall, entire anterior septum, entire inferior wall, posterior wall, mid anterolateral segment, and mid  inferoseptal segment are hypokinetic. Right Ventricle: The right ventricular size is normal. No increase in right ventricular wall thickness. Right ventricular systolic function is normal. Left Atrium: Left atrial size was normal in size. Right Atrium: Right atrial size was normal in size. Pericardium: There is no evidence of pericardial effusion. Mitral Valve: The mitral valve is grossly normal. Mild mitral valve regurgitation. MV peak gradient, 2.8 mmHg. The mean mitral valve gradient is 1.0 mmHg. Tricuspid Valve: The tricuspid valve is not well visualized. Tricuspid valve regurgitation is trivial. Aortic Valve: The aortic valve is grossly normal. Aortic valve regurgitation is not visualized. Aortic valve mean gradient measures 2.0 mmHg. Aortic valve peak gradient measures 2.7 mmHg. Aortic valve area, by VTI measures 3.20 cm. Pulmonic Valve: The pulmonic valve was not well visualized. Pulmonic valve regurgitation is trivial. Aorta: The aortic root is normal in size and structure. IAS/Shunts: The interatrial septum was not well visualized.  LEFT VENTRICLE PLAX 2D LVOT diam:     2.30 cm      Diastology LV SV:         41           LV e' medial:    5.66 cm/s LV SV Index:   18           LV E/e' medial:  14.9 LVOT Area:     4.15 cm     LV e' lateral:   10.00 cm/s                             LV E/e' lateral: 8.4  LV Volumes (MOD) LV vol d, MOD A2C: 76.7 ml LV vol d, MOD A4C: 158.0 ml LV vol s, MOD A2C: 55.7 ml LV vol s, MOD A4C: 136.0 ml LV SV MOD A2C:     21.0 ml LV SV MOD A4C:     158.0 ml LV SV MOD BP:      23.6 ml RIGHT VENTRICLE RV Basal diam:  2.80 cm LEFT ATRIUM             Index       RIGHT ATRIUM           Index LA Vol (A2C):   35.4 ml 15.75 ml/m RA Area:     10.10 cm LA Vol (A4C):   36.2 ml 16.11 ml/m RA Volume:   20.00 ml  8.90 ml/m LA Biplane Vol: 39.6 ml 17.62 ml/m  AORTIC VALVE AV Area (Vmax):    3.79 cm AV Area (Vmean):   3.71 cm AV Area (VTI):     3.20 cm AV Vmax:           81.60 cm/s AV Vmean:           57.400 cm/s AV VTI:            0.127 m AV Peak Grad:      2.7 mmHg AV Mean Grad:      2.0 mmHg LVOT Vmax:         74.40 cm/s LVOT Vmean:        51.300 cm/s LVOT VTI:          0.098 m LVOT/AV VTI ratio: 0.77  AORTA Ao Root diam: 3.10 cm MITRAL VALVE MV Area (PHT): 3.53 cm    SHUNTS MV Area VTI:   1.74 cm  Systemic VTI:  0.10 m MV Peak grad:  2.8 mmHg    Systemic Diam: 2.30 cm MV Mean grad:  1.0 mmHg MV Vmax:       0.83 m/s MV Vmean:      35.6 cm/s MV Decel Time: 215 msec MV E velocity: 84.40 cm/s MV A velocity: 30.00 cm/s MV E/A ratio:  2.81 Bartholome Bill MD Electronically signed by Bartholome Bill MD Signature Date/Time: 08/22/2020/4:52:09 PM    Final     Assessment & Plan     Active Problems:   STEMI involving left anterior descending coronary artery [facility]) Multisystem organ failure including shock liver Respiratory failure Ventricular fibrillation arrest Cardiomyopathy Peripheral vascular disease Alcohol abuse Status post PCI and stent to proximal LAD Covid positive  Plan Agree with ICU level critical care Continue respiratory support with vent management Maintain IV amiodarone loading drip. Pt had recurrent afib with rvr when off of amiodarone iv so would maintain for now. uUrrently in nsr.  Continue aspirin and Brilinta down the NG tube Inhalers as necessary for COPD Consider prophylactic CIWA protocol Prophylactic therapy for reflux probably with Protonix DVT prophylaxis Continue aggressive critical care management for multisystem organ failure Consider CCR T because of worsening renal function Would recommend conservative cardiac input at this point  Signed, Javier Docker. Moriah Shawley MD 08/24/2020, 4:35 PM  Pager: [phone]

## 2020-08-24 NOTE — Plan of Care (Signed)
  Problem: Respiratory: Goal: Will maintain a patent airway Outcome: Progressing   

## 2020-08-24 NOTE — Progress Notes (Signed)
Patient remains in Afib w/RVR. Administered Cardizem bolus with no sustained effect. 2.1 sec pause post administration. Potassium 5.6. Pt rec Lokelma, D50, and insulin. After D50 and Insulin pt had several pauses, followed by a dip in rate to 60s, and a burst of SVT. Now back in Afib [id number]. BP soft 106/60. Cardiology contacted for recommendations.

## 2020-08-24 NOTE — Progress Notes (Signed)
ANTICOAGULATION CONSULT NOTE  Pharmacy Consult for heparin infusion Indication: atrial fibrillation  No Known Allergies  Patient Measurements: Height: 5\' 8"  (172.7 cm) Weight: 98.3 kg (216 lb 11.4 oz) IBW/kg (Calculated) : 68.4 Heparin Dosing Weight: 93.9 kg  Vital Signs: Temp: 100.9 F (38.3 C) (04/09 0615) BP: 92/58 (04/09 0615) Pulse Rate: 115 (04/09 0615)  Labs: Recent Labs    22-Aug-2020 2052 22-Aug-2020 2254 08/22/20 0500 08/22/20 0941 08/22/20 1257 08/23/20 0520 08/23/20 2323 08/24/20 0527  HGB 12.5*  --    < > 12.9*  --  11.6*  --  10.8*  HCT 39.6  --    < > 40.7  --  36.8*  --  33.5*  PLT 286  --    < > 336  --  240  --  193  APTT 35  --   --   --   --   --   --   --   LABPROT 15.2  --   --   --   --   --   --   --   INR 1.3*  --   --   --   --   --   --   --   HEPARINUNFRC  --   --   --   --   --   --   --  0.10*  CREATININE 1.63*  --    < > 2.53*  --  4.20* 6.36*  --   TROPONINIHS 2,918* 3,591*  --  4,109* 4,276*  --   --   --    < > = values in this interval not displayed.    Estimated Creatinine Clearance: 13.9 mL/min (A) (by C-G formula based on SCr of 6.36 mg/dL (H)).   Medical History: Past Medical History:  Diagnosis Date  . Alcoholic (HCC)   . Chronic kidney disease   . COPD (chronic obstructive pulmonary disease) (HCC)   . Coronary artery disease   . DVT (deep venous thrombosis) (HCC)   . Heartburn   . Hypertension   . Lymphedema   . Peripheral vascular disease (HCC)   . Varicose veins     Medications:   Pt ordered Brilinta 90mg  BID post Coronary Intervention Pt ordered Heparin 5000 units q8hr w/ last dose 4/8 @ 2159.  Assessment: Pt is 60 yo male with significant cardiovascular, pulmonary, & kidney disease now in Afib.  NP consulting pharmacy to dose therapeutic heparin.  Due to concomitant therapy with Brilinta, pharmacy asked to target lower end of therapeutic range and monitor platelets closely.  Goal of Therapy:  Heparin level  0.3-0.7 units/ml Monitor platelets by anticoagulation protocol: Yes   04/09 0527 HL 0.1, subtherapeutic   Plan:  Give 2800 unit bolus x 1 Increase heparin infusion to 1600 units/hr Check HL in 6 hr after infusion started Monitor H&H and Plts at least once daily or more frequently as needed   77, PharmD, [facility] 08/24/2020 6:40 AM

## 2020-08-24 NOTE — Progress Notes (Signed)
Spoke with Dr. Lady Gary about patient HR and rhythm, along with multiple pauses. Recommended 150mg  bolus and restarted Amiodarone gtt. Orders placed. Will continue to monitor.

## 2020-08-24 NOTE — Plan of Care (Signed)
  Problem: Clinical Measurements: Goal: Diagnostic test results will improve Outcome: Not Progressing Goal: Cardiovascular complication will be avoided Outcome: Not Progressing   Problem: Respiratory: Goal: Will maintain a patent airway Outcome: Progressing

## 2020-08-24 NOTE — Progress Notes (Addendum)
Updated patient's brother Vincent [name] via phone.  Vincent [name]'s phone number is [phone].  The patient's son has deferred all decision making to Vincent [name].  Patient's son has been estranged from the father.  I updated Vincent [name], advised him of the patient's poor prognosis.  Vincent [name] is arranging for family to come and visit.  They are in [place], cannot make it in before Tuesday (12 April).  I advised Vincent [name] that we will continue supportive care however, given the patient's tenuous and severely critically ill status he may not survive until then.  Vincent [name] understands this.  Patient remains DNR.  Gailen Shelter, MD [facility]   *This note was dictated using voice recognition software/Dragon.  Despite best efforts to proofread, errors can occur which can change the meaning.  Any change was purely unintentional.

## 2020-08-24 NOTE — Progress Notes (Signed)
PHARMACY CONSULT NOTE - FOLLOW UP  Pharmacy Consult for Electrolyte Monitoring and Replacement   Recent Labs: Potassium (mmol/L)  Date Value  08/24/2020 5.8 (H)  03/03/2014 4.1   Magnesium (mg/dL)  Date Value  46/96/2952 2.8 (H)   Calcium (mg/dL)  Date Value  84/13/2440 6.9 (L)   Calcium, Total (mg/dL)  Date Value  03/14/2535 8.0 (L)   Albumin (g/dL)  Date Value  64/40/3474 2.6 (L)   Phosphorus (mg/dL)  Date Value  25/95/6387 12.0 (H)   Sodium (mmol/L)  Date Value  08/24/2020 137  03/03/2014 138   Corrected Ca: 8.44  Assessment: Patient is a 60 y/o M who was BIBEMS on the evening of 4/6 as code STEMI and was emergently taken to cath lab with findings of completed occluded proximal LAD lesion which was treated with DES x 2. Pharmacy has been consulted to monitor and replace electrolytes.   Goal of Therapy:  K 4.0-5.1 Mg 2.0-2.4 All other electrolytes WNL  Plan:  --K 5.8 - on lokelma for 5 doses.  --No electrolyte replacement needed at this time  --Monitor electrolytes with AM labs   Ronnald Ramp, PharmD 08/24/2020 8:27 AM

## 2020-08-24 NOTE — Progress Notes (Signed)
ANTICOAGULATION CONSULT NOTE  Pharmacy Consult for heparin infusion Indication: atrial fibrillation  No Known Allergies  Patient Measurements: Height: 5\' 8"  (172.7 cm) Weight: 98.3 kg (216 lb 11.4 oz) IBW/kg (Calculated) : 68.4 Heparin Dosing Weight: 93.9 kg  Vital Signs: Temp: 100.76 F (38.2 C) (04/09 1000) BP: 106/55 (04/09 1200) Pulse Rate: 62 (04/09 1200)  Labs: Recent Labs    Aug 31, 2020 2052 08/31/20 2254 08/22/20 0500 08/22/20 0941 08/22/20 1257 08/23/20 0520 08/23/20 2323 08/24/20 0527 08/24/20 1301  HGB 12.5*  --    < > 12.9*  --  11.6*  --  10.8*  --   HCT 39.6  --    < > 40.7  --  36.8*  --  33.5*  --   PLT 286  --    < > 336  --  240  --  193  --   APTT 35  --   --   --   --   --   --   --   --   LABPROT 15.2  --   --   --   --   --   --   --   --   INR 1.3*  --   --   --   --   --   --   --   --   HEPARINUNFRC  --   --   --   --   --   --   --  0.10* 0.41  CREATININE 1.63*  --    < > 2.53*  --  4.20* 6.36* 7.02*  --   TROPONINIHS 2,918* 3,591*  --  4,109* 4,276*  --   --   --   --    < > = values in this interval not displayed.    Estimated Creatinine Clearance: 12.6 mL/min (A) (by C-G formula based on SCr of 7.02 mg/dL (H)).   Medical History: Past Medical History:  Diagnosis Date  . Alcoholic (HCC)   . Chronic kidney disease   . COPD (chronic obstructive pulmonary disease) (HCC)   . Coronary artery disease   . DVT (deep venous thrombosis) (HCC)   . Heartburn   . Hypertension   . Lymphedema   . Peripheral vascular disease (HCC)   . Varicose veins     Medications:   Pt ordered Brilinta 90mg  BID post Coronary Intervention Pt ordered Heparin 5000 units q8hr w/ last dose 4/8 @ 2159.  Assessment: Pt is 60 yo male with significant cardiovascular, pulmonary, & kidney disease now in Afib.  NP consulting pharmacy to dose therapeutic heparin.  Due to concomitant therapy with Brilinta, pharmacy asked to target lower end of therapeutic range and  monitor platelets closely.  4/9 1301 HL 0.41   Goal of Therapy:  Heparin level 0.3-0.7 units/ml Monitor platelets by anticoagulation protocol: Yes   04/09 0527 HL 0.1, subtherapeutic   Plan:  Heparin level is therapeutic. Will continue heparin infusion at 1600 units/hr. Recheck heparin level in 8 hours. CBC daily while on heparin.    6/9, PharmD,  08/24/2020 1:32 PM

## 2020-08-25 ENCOUNTER — Encounter: Payer: Self-pay | Admitting: Internal Medicine

## 2020-08-25 LAB — CBC WITH DIFFERENTIAL/PLATELET
Abs Immature Granulocytes: 0.19 10*3/uL — ABNORMAL HIGH (ref 0.00–0.07)
Basophils Absolute: 0 10*3/uL (ref 0.0–0.1)
Basophils Relative: 0 %
Eosinophils Absolute: 0 10*3/uL (ref 0.0–0.5)
Eosinophils Relative: 0 %
HCT: 32.6 % — ABNORMAL LOW (ref 39.0–52.0)
Hemoglobin: 10.2 g/dL — ABNORMAL LOW (ref 13.0–17.0)
Immature Granulocytes: 1 %
Lymphocytes Relative: 4 %
Lymphs Abs: 0.6 10*3/uL — ABNORMAL LOW (ref 0.7–4.0)
MCH: 29.4 pg (ref 26.0–34.0)
MCHC: 31.3 g/dL (ref 30.0–36.0)
MCV: 93.9 fL (ref 80.0–100.0)
Monocytes Absolute: 0.7 10*3/uL (ref 0.1–1.0)
Monocytes Relative: 4 %
Neutro Abs: 14.3 10*3/uL — ABNORMAL HIGH (ref 1.7–7.7)
Neutrophils Relative %: 91 %
Platelets: 190 10*3/uL (ref 150–400)
RBC: 3.47 MIL/uL — ABNORMAL LOW (ref 4.22–5.81)
RDW: 13.6 % (ref 11.5–15.5)
WBC: 15.7 10*3/uL — ABNORMAL HIGH (ref 4.0–10.5)
nRBC: 0.3 % — ABNORMAL HIGH (ref 0.0–0.2)

## 2020-08-25 LAB — COMPREHENSIVE METABOLIC PANEL
ALT: 1035 U/L — ABNORMAL HIGH (ref 0–44)
AST: 317 U/L — ABNORMAL HIGH (ref 15–41)
Albumin: 2.7 g/dL — ABNORMAL LOW (ref 3.5–5.0)
Alkaline Phosphatase: 93 U/L (ref 38–126)
Anion gap: 18 — ABNORMAL HIGH (ref 5–15)
BUN: 101 mg/dL — ABNORMAL HIGH (ref 8–23)
CO2: 23 mmol/L (ref 22–32)
Calcium: 7 mg/dL — ABNORMAL LOW (ref 8.9–10.3)
Chloride: 98 mmol/L (ref 98–111)
Creatinine, Ser: 9.16 mg/dL — ABNORMAL HIGH (ref 0.61–1.24)
GFR, Estimated: 6 mL/min — ABNORMAL LOW (ref >60)
Glucose, Bld: 160 mg/dL — ABNORMAL HIGH (ref 70–99)
Potassium: 5.7 mmol/L — ABNORMAL HIGH (ref 3.5–5.1)
Sodium: 139 mmol/L (ref 135–145)
Total Bilirubin: 0.5 mg/dL (ref 0.3–1.2)
Total Protein: 5.8 g/dL — ABNORMAL LOW (ref 6.5–8.1)

## 2020-08-25 LAB — C-REACTIVE PROTEIN: CRP: 10 mg/dL — ABNORMAL HIGH (ref <1.0)

## 2020-08-25 LAB — FERRITIN: Ferritin: 2318 ng/mL — ABNORMAL HIGH (ref 24–336)

## 2020-08-25 LAB — HEPARIN LEVEL (UNFRACTIONATED): Heparin Unfractionated: 0.5 IU/mL (ref 0.30–0.70)

## 2020-08-25 LAB — GLUCOSE, CAPILLARY
Glucose-Capillary: 125 mg/dL — ABNORMAL HIGH (ref 70–99)
Glucose-Capillary: 145 mg/dL — ABNORMAL HIGH (ref 70–99)
Glucose-Capillary: 159 mg/dL — ABNORMAL HIGH (ref 70–99)
Glucose-Capillary: 165 mg/dL — ABNORMAL HIGH (ref 70–99)
Glucose-Capillary: 165 mg/dL — ABNORMAL HIGH (ref 70–99)
Glucose-Capillary: 177 mg/dL — ABNORMAL HIGH (ref 70–99)

## 2020-08-25 LAB — D-DIMER, QUANTITATIVE: D-Dimer, Quant: 8.32 ug/mL-FEU — ABNORMAL HIGH (ref 0.00–0.50)

## 2020-08-25 LAB — PHOSPHORUS: Phosphorus: 13 mg/dL — ABNORMAL HIGH (ref 2.5–4.6)

## 2020-08-25 LAB — MAGNESIUM: Magnesium: 2.9 mg/dL — ABNORMAL HIGH (ref 1.7–2.4)

## 2020-08-25 LAB — POTASSIUM: Potassium: 6.4 mmol/L (ref 3.5–5.1)

## 2020-08-25 MED ORDER — INSULIN ASPART 100 UNIT/ML IV SOLN
10.0000 [IU] | Freq: Once | INTRAVENOUS | Status: AC
  Administered 2020-08-25: 10 [IU] via INTRAVENOUS
  Filled 2020-08-25: qty 0.1

## 2020-08-25 MED ORDER — CALCIUM GLUCONATE-NACL 1-0.675 GM/50ML-% IV SOLN
1.0000 g | Freq: Once | INTRAVENOUS | Status: AC
  Administered 2020-08-25: 1000 mg via INTRAVENOUS
  Filled 2020-08-25 (×3): qty 50

## 2020-08-25 MED ORDER — DEXTROSE 50 % IV SOLN
1.0000 | Freq: Once | INTRAVENOUS | Status: AC
  Administered 2020-08-25: 50 mL via INTRAVENOUS
  Filled 2020-08-25: qty 50

## 2020-08-25 MED ORDER — SODIUM BICARBONATE 8.4 % IV SOLN
50.0000 meq | Freq: Once | INTRAVENOUS | Status: AC
  Administered 2020-08-25: 50 meq via INTRAVENOUS
  Filled 2020-08-25: qty 50

## 2020-08-25 NOTE — Progress Notes (Signed)
PHARMACY CONSULT NOTE - FOLLOW UP  Pharmacy Consult for Electrolyte Monitoring and Replacement   Recent Labs: Potassium (mmol/L)  Date Value  08/25/2020 5.7 (H)  03/03/2014 4.1   Magnesium (mg/dL)  Date Value  51/76/1607 2.9 (H)   Calcium (mg/dL)  Date Value  37/02/6268 7.0 (L)   Calcium, Total (mg/dL)  Date Value  48/54/6270 8.0 (L)   Albumin (g/dL)  Date Value  35/00/9381 2.7 (L)   Phosphorus (mg/dL)  Date Value  82/99/3716 13.0 (H)   Sodium (mmol/L)  Date Value  08/25/2020 139  03/03/2014 138   Corrected Ca: 8.44  Assessment: Patient is a 60 y/o M who was BIBEMS on the evening of 4/6 as code STEMI and was emergently taken to cath lab with findings of completed occluded proximal LAD lesion which was treated with DES x 2. Pharmacy has been consulted to monitor and replace electrolytes. Scr continued to trend up.   On amiodarone gtt.   Goal of Therapy:  K 4.0-5.1 Mg 2.0-2.4 All other electrolytes WNL  Plan:  --K 5.8 > 5.7 - on lokelma, s/p IV insulin on 4/9.  --No electrolyte replacement needed at this time  --Monitor electrolytes with AM labs   Ronnald Ramp, PharmD 08/25/2020 8:16 AM

## 2020-08-25 NOTE — Progress Notes (Signed)
Patient Name: MARKS SCALERA Date of Encounter: 08/25/2020  Hospital Problem List     Active Problems:   STEMI involving left anterior descending coronary artery [facility])    Patient Profile      60 yo male who presented to [facility] ER via EMS post cardiac arrest on 04/6. Per ER notes EMS reported the pt was seated at a local bar when he suddenly became unresponsive and fell off a barstool. EMS and first responders notified and upon their arrival pt pulseless, CPR started and AED applied. Pt required 1 shock, and ROSC achieved immediately. EMS inserted a King airway in the field. Code STEMI initiated. En route to the ER pt became agitated and delirious, therefore EMS administered versed. However, despite versed the pt ripped his Edison Pace airway out .   Upon arrival to the ER pt remained altered and agitated with NRB in place and unable to redirect. Therefore, pt required mechanical intubation.EKG suggestive of ST elevation anteriorly.ER physician contacted on call Cardiologist Dr. Clayborn Bigness and pt transported to cath lab for emergent cardiac catheterization.  Cardiac cath revealed 100% occluded proximal LAD IRA requiring PCI and stent DES overlapping 2 stent to the proximal to mid LAD.Post procedure pt admitted to ICU [facility] team consulted to assist with management. Found to be covid positive and received remdesivir  Subjective   intubated  Inpatient Medications    . amiodarone  150 mg Intravenous Once  . vitamin C  500 mg Per Tube Daily  . aspirin  81 mg Per Tube Daily  . chlorhexidine gluconate (MEDLINE KIT)  15 mL Mouth Rinse BID  . Chlorhexidine Gluconate Cloth  6 each Topical Daily  . docusate  100 mg Per Tube BID  . feeding supplement (PROSource TF)  45 mL Per Tube QID  . feeding supplement (VITAL AF 1.2 CAL)  1,000 mL Per Tube Q24H  . fentaNYL (SUBLIMAZE) injection  100 mcg Intravenous Once  . folic acid  1 mg Intravenous Daily  . free water  30 mL Per Tube Q4H  .  hydrocortisone sod succinate (SOLU-CORTEF) inj  50 mg Intravenous Q6H  . insulin aspart  0-9 Units Subcutaneous Q4H  . insulin aspart  3 Units Subcutaneous Q4H  . mouth rinse  15 mL Mouth Rinse 10 times per day  . midodrine  5 mg Per Tube TID WC  . pantoprazole (PROTONIX) IV  40 mg Intravenous Q24H  . polyethylene glycol  17 g Per Tube Daily  . sodium chloride flush  10-40 mL Intracatheter Q12H  . sodium zirconium cyclosilicate  10 g Per Tube Daily  . thiamine injection  100 mg Intravenous Daily  . ticagrelor  90 mg Per Tube BID  . zinc sulfate  220 mg Per Tube Daily    Vital Signs    Vitals:   08/25/20 0700 08/25/20 0800 08/25/20 0900 08/25/20 1000  BP: (!) 108/58 (!) 103/59 109/71   Pulse: 96 88 85 86  Resp: (!) 28 (!) 30 (!) 30 (!) 30  Temp: 98.24 F (36.8 C) 97.88 F (36.6 C) 97.7 F (36.5 C) 97.7 F (36.5 C)  TempSrc:  Bladder    SpO2: 91% 90% 91% 92%  Weight:      Height:        Intake/Output Summary (Last 24 hours) at 08/25/2020 1146 Last data filed at 08/25/2020 1000 Gross per 24 hour  Intake 1897.64 ml  Output 30 ml  Net 1867.64 ml   Filed Weights   09/07/2020 2057  08/24/20 0445 08/25/20 0500  Weight: 113.4 kg 98.3 kg 98.6 kg    Physical Exam    GEN: Well nourished, well developed, in no acute distress.  HEENT: normal.  Neck: Supple, no JVD, carotid bruits, or masses. Cardiac: RRR, no murmurs, rubs, or gallops. No clubbing, cyanosis, edema.  Radials/DP/PT 2+ and equal bilaterally.  Respiratory:  Respirations regular and unlabored, clear to auscultation bilaterally. GI: Soft, nontender, nondistended, BS + x 4. MS: no deformity or atrophy. Skin: warm and dry, no rash. Neuro:  Strength and sensation are intact. Psych: Normal affect.  Labs    CBC Recent Labs    08/24/20 0527 08/25/20 0245  WBC 13.5* 15.7*  NEUTROABS 12.2* 14.3*  HGB 10.8* 10.2*  HCT 33.5* 32.6*  MCV 92.8 93.9  PLT 193 433   Basic Metabolic Panel Recent Labs    08/24/20 0527  08/24/20 2018 08/25/20 0245  NA 137  --  139  K 5.8* 6.2* 5.7*  CL 100  --  98  CO2 24  --  23  GLUCOSE 166*  --  160*  BUN 80*  --  101*  CREATININE 7.02*  --  9.16*  CALCIUM 6.9*  --  7.0*  MG 2.8*  --  2.9*  PHOS 12.0*  --  13.0*   Liver Function Tests Recent Labs    08/24/20 0527 08/25/20 0245  AST 473* 317*  ALT 1,193* 1,035*  ALKPHOS 86 93  BILITOT 0.8 0.5  PROT 5.6* 5.8*  ALBUMIN 2.6* 2.7*   No results for input(s): LIPASE, AMYLASE in the last 72 hours. Cardiac Enzymes No results for input(s): CKTOTAL, CKMB, CKMBINDEX, TROPONINI in the last 72 hours. BNP No results for input(s): BNP in the last 72 hours. D-Dimer Recent Labs    08/24/20 0527 08/25/20 0501  DDIMER 17.21* 8.32*   Hemoglobin A1C No results for input(s): HGBA1C in the last 72 hours. Fasting Lipid Panel No results for input(s): CHOL, HDL, LDLCALC, TRIG, CHOLHDL, LDLDIRECT in the last 72 hours. Thyroid Function Tests No results for input(s): TSH, T4TOTAL, T3FREE, THYROIDAB in the last 72 hours.  Invalid input(s): FREET3  Telemetry    nsr with occasional 2-3 second pauses.   ECG       Radiology    DG Abd 1 View  Result Date: 08/22/2020 CLINICAL DATA:  Orogastric tube placement. EXAM: ABDOMEN - 1 VIEW COMPARISON:  Chest x-ray 08/22/2020. FINDINGS: Orogastric tube noted with tip over the stomach. Mild gastric distention. No bowel distention. No free air. Contrast noted in the kidneys. Degenerative changes and scoliosis thoracic spine. IMPRESSION: Orogastric tube noted with tip over the stomach. Mild gastric distention. Electronically Signed   By: Marcello Moores  Register   On: 08/22/2020 11:43   CT HEAD WO CONTRAST  Result Date: 08/22/2020 CLINICAL DATA:  60 year old male status post cardiac arrest, STEMI. EXAM: CT HEAD WITHOUT CONTRAST TECHNIQUE: Contiguous axial images were obtained from the base of the skull through the vertex without intravenous contrast. COMPARISON:  Brain MRI 08/19/2016.  FINDINGS: Brain: No midline shift, mass effect, or evidence of intracranial mass lesion. No ventriculomegaly. No acute intracranial hemorrhage identified. No cortically based acute infarct identified. Gray-white matter differentiation appears symmetric and within normal limits throughout the brain. Normal basilar cisterns. Vascular: Extensive Calcified atherosclerosis at the skull base. Intravascular contrast appears to be present. No suspicious intracranial vascular hyperdensity. Skull: No acute osseous abnormality identified. Sinuses/Orbits: Scattered sinus fluid, mucosal thickening and opacification. Fluid in the nasal cavity and nasopharynx. Intubated. Tympanic cavities  and mastoids remain well aerated. Other: Generalized scalp soft tissue edema. Right anterior vertex broad-based scalp hematoma. Incidental small benign left vertex scalp lipoma. IMPRESSION: 1. Intravascular contrast suspected. Normal CT appearance of the brain. 2. Generalized scalp soft tissue edema and right vertex scalp hematoma. 3. Intubated.  Fluid in the pharynx, paranasal sinuses. Electronically Signed   By: Genevie Ann M.D.   On: 08/22/2020 06:19   CARDIAC CATHETERIZATION  Result Date: 08/22/2020  Prox RCA to Mid RCA lesion is 70% stenosed.  Prox Cx lesion is 25% stenosed.  Prox LAD lesion is 100% stenosed.  A drug-eluting stent was successfully placed using a STENT RESOLUTE ONYX 2.5X30.  Post intervention, there is a 0% residual stenosis.  Mid LAD lesion is 75% stenosed.  A drug-eluting stent was successfully placed using a STENT RESOLUTE ONYX 2.25X30.  Post intervention, there is a 0% residual stenosis.  Conclusion conclusion STEMI presentation with sudden death respiratory failure intubated sedated severely depressed left ventricular function globally ejection fraction less than 25%  Conclusion STEMI presentation anterior STEMI Complicated by sudden death respiratory failure intubated sedated Diagnostic procedure Severely  depressed left ventricular function of less than 25% globally with moderate left ventricular enlargement Coronaries Left main large free of disease LAD large with 100% proximal occlusion IRA TIMI 0 flow Circumflex large with minor irregularities RCA large with moderate to severe mid disease TIMI-3 flow Intervention Successful PCI and stent of proximal and mid LAD with overlapping stents DES resolute Onyx Proximal 2.5 x 30 mm mid 2.25 x 30 mm Lesion reduced from 100% down to 0 proximally and 75% down to 0 mid TIMI 0 was transformed to TIMI-3 flow by the end of the case Minx was deployed in the left femoral artery Cangrelor infusion for 2 hours Angiomax at reduced rate for an additional 4 hours Aspirin 325 and Brilinta 180 down the NG Amiodarone IV post arrest load and infusion Patient was maintained on the vent intubated sedated Patient was transported to intensive care for critical care management No complications  US ARTERIAL LOWER EXTREMITY DUPLEX RIGHT(NON-ABI)  Result Date: 08/22/2020 CLINICAL DATA:  60 year old male with cyanosis EXAM: UNILATERAL LOWER EXTREMITY ARTERIAL DUPLEX SCAN TECHNIQUE: Gray-scale sonography as well as color Doppler and duplex ultrasound was performed to evaluate the arteries of SINGLE lower EXTREMITY including the common, superficial and profunda femoral arteries, popliteal artery and calf arteries. COMPARISON:  None. FINDINGS: Right Lower Extremity Directed duplex demonstrates monophasic waveform of common femoral artery, profunda femoris. SFA is occluded to the popliteal artery. Reconstitution of the popliteal artery with monophasic waveform. Monophasic tibial arteries. Left Lower Extremity Directed duplex left lower extremity demonstrates monophasic common femoral artery IMPRESSION: Directed duplex right lower extremity demonstrates monophasic common femoral artery, occlusion of the SFA, and reconstitution of the popliteal artery. Monophasic waveform of the tibial arteries.  Signed, Dulcy Fanny. Dellia Nims, RPVI [facility] Radiology Electronically Signed   By: Corrie Mckusick D.O.   On: 08/22/2020 14:34   DG Chest Port 1 View  Result Date: 08/22/2020 CLINICAL DATA:  Central line placement EXAM: PORTABLE CHEST 1 VIEW COMPARISON:  08/29/2020 FINDINGS: Left central line is in place with the tip in the upper SVC. No pneumothorax. Endotracheal tube and NG tube are unchanged. Diffuse bilateral airspace disease, worsening on the left since prior study. IMPRESSION: Left central line tip in the upper SVC.  No pneumothorax. Diffuse bilateral airspace disease, worsening on the left since prior study Electronically Signed   By: Lennette Bihari  [place] M.D.   On: 08/22/2020 01:03   DG Chest Portable 1 View  Result Date: 09/12/2020 CLINICAL DATA:  Status post intubation.  Field STEMI. EXAM: PORTABLE CHEST 1 VIEW COMPARISON:  Chest x-ray 03/05/2014 FINDINGS: Endotracheal tube with tip approximately 3 cm above the carina. Enteric tube coursing below the hemidiaphragm with tip collimated off view and side port overlying the gastric lumen. The heart size and mediastinal contours are unchanged. Aortic arch calcifications. No focal consolidation. Increased interstitial markings. Right costophrenic angle collimated off view. No pleural effusion. No pneumothorax. No acute osseous abnormality. IMPRESSION: Endotracheal tube and enteric tube in grossly appropriate position. Pulmonary edema. Electronically Signed   By: Iven Finn M.D.   On: 09/09/2020 21:14   ECHOCARDIOGRAM COMPLETE  Result Date: 08/22/2020    ECHOCARDIOGRAM REPORT   Patient Name:   TESLA BOCHICCHIO Date of Exam: 08/22/2020 Medical Rec #:  [id number]       Height:       68.0 in Accession #:    [id number]      Weight:       250.0 lb Date of Birth:  1960/10/07       BSA:          2.247 m Patient Age:    56 years        BP:           102/58 mmHg Patient Gender: M               HR:           62 bpm. Exam  Location:  [facility] Procedure: 2D Echo, Color Doppler, Cardiac Doppler and Intracardiac            Opacification Agent Indications:     I21.9 Myocardial Infarct  History:         Patient has no prior history of Echocardiogram examinations.                  CAD, COPD and CKD; Risk Factors:Hypertension. Pt tested                  positive for COVID-19 on 09/07/2020.  Sonographer:     Charmayne Sheer RDCS (AE) Referring Phys:  [id number] [facility] D CALLWOOD Diagnosing Phys: Bartholome Bill MD  Sonographer Comments: Suboptimal parasternal window and echo performed with patient supine and on artificial respirator. Image acquisition challenging due to COPD. Global longitudinal strain was attempted. IMPRESSIONS  1. Probable small apical thrombus. Left ventricular ejection fraction, by estimation, is 20 to 25%. The left ventricle has severely decreased function. The left ventricle demonstrates regional wall motion abnormalities (see scoring diagram/findings for description). The left ventricular internal cavity size was mildly dilated. Left ventricular diastolic parameters were normal.  2. Right ventricular systolic function is normal. The right ventricular size is normal.  3. The mitral valve is grossly normal. Mild mitral valve regurgitation.  4. The aortic valve is grossly normal. Aortic valve regurgitation is not visualized. FINDINGS  Left Ventricle: Probable small apical thrombus. Left ventricular ejection fraction, by estimation, is 20 to 25%. The left ventricle has severely decreased function. The left ventricle demonstrates regional wall motion abnormalities. Definity contrast agent was given IV to delineate the left ventricular endocardial borders. The left ventricular internal cavity size was mildly dilated. There is borderline left ventricular hypertrophy. Left ventricular diastolic parameters were normal.  LV Wall Scoring: The apex is akinetic. The entire anterior wall, mid and distal lateral wall, entire anterior septum, entire  inferior wall, posterior wall, mid anterolateral segment, and mid inferoseptal segment are hypokinetic. Right Ventricle: The right ventricular size is normal. No increase in right ventricular wall thickness. Right ventricular systolic function is normal. Left Atrium: Left atrial size was normal in size. Right Atrium: Right atrial size was normal in size. Pericardium: There is no evidence of pericardial effusion. Mitral Valve: The mitral valve is grossly normal. Mild mitral valve regurgitation. MV peak gradient, 2.8 mmHg. The mean mitral valve gradient is 1.0 mmHg. Tricuspid Valve: The tricuspid valve is not well visualized. Tricuspid valve regurgitation is trivial. Aortic Valve: The aortic valve is grossly normal. Aortic valve regurgitation is not visualized. Aortic valve mean gradient measures 2.0 mmHg. Aortic valve peak gradient measures 2.7 mmHg. Aortic valve area, by VTI measures 3.20 cm. Pulmonic Valve: The pulmonic valve was not well visualized. Pulmonic valve regurgitation is trivial. Aorta: The aortic root is normal in size and structure. IAS/Shunts: The interatrial septum was not well visualized.  LEFT VENTRICLE PLAX 2D LVOT diam:     2.30 cm      Diastology LV SV:         41           LV e' medial:    5.66 cm/s LV SV Index:   18           LV E/e' medial:  14.9 LVOT Area:     4.15 cm     LV e' lateral:   10.00 cm/s                             LV E/e' lateral: 8.4  LV Volumes (MOD) LV vol d, MOD A2C: 76.7 ml LV vol d, MOD A4C: 158.0 ml LV vol s, MOD A2C: 55.7 ml LV vol s, MOD A4C: 136.0 ml LV SV MOD A2C:     21.0 ml LV SV MOD A4C:     158.0 ml LV SV MOD BP:      23.6 ml RIGHT VENTRICLE RV Basal diam:  2.80 cm LEFT ATRIUM             Index       RIGHT ATRIUM           Index LA Vol (A2C):   35.4 ml 15.75 ml/m RA Area:     10.10 cm LA Vol (A4C):   36.2 ml 16.11 ml/m RA Volume:   20.00 ml  8.90 ml/m LA Biplane Vol: 39.6 ml 17.62 ml/m  AORTIC VALVE AV Area (Vmax):    3.79 cm AV Area (Vmean):   3.71 cm AV  Area (VTI):     3.20 cm AV Vmax:           81.60 cm/s AV Vmean:          57.400 cm/s AV VTI:            0.127 m AV Peak Grad:      2.7 mmHg AV Mean Grad:      2.0 mmHg LVOT Vmax:         74.40 cm/s LVOT Vmean:        51.300 cm/s LVOT VTI:          0.098 m LVOT/AV VTI ratio: 0.77  AORTA Ao Root diam: 3.10 cm MITRAL VALVE MV Area (PHT): 3.53 cm    SHUNTS MV Area VTI:   1.74 cm    Systemic VTI:  0.10 m MV Peak grad:  2.8 mmHg    Systemic Diam: 2.30 cm MV Mean grad:  1.0 mmHg MV Vmax:       0.83 m/s MV Vmean:      35.6 cm/s MV Decel Time: 215 msec MV E velocity: 84.40 cm/s MV A velocity: 30.00 cm/s MV E/A ratio:  2.81 Bartholome Bill MD Electronically signed by Bartholome Bill MD Signature Date/Time: 08/22/2020/4:52:09 PM    Final     Assessment & Plan     Active Problems: STEMI involving left anterior descending coronary artery [facility]) Multisystem organ failure including shock liver Respiratory failure Ventricular fibrillation arrest Cardiomyopathy Peripheral vascular disease Alcohol abuse Status post PCI and stent to proximal LAD Covid positive  Plan Agree with ICU level critical care Continue respiratory support with vent management Maintain IV amiodarone loading drip. Pt had recurrent afib with rvr . Will need to continue iv amiodarone for now. Aware of pauses but these do not appear prolonged and will follow for now.  Continue aspirin and [place] theNG tube Inhalers as necessary for COPD  Would recommend conservative cardiac input at this point  Signed, Javier Docker. Cordarrel Stiefel MD 08/25/2020, 11:46 AM  Pager: [phone]

## 2020-08-25 NOTE — Progress Notes (Signed)
Date and time results received: 08/25/20 2300  Test: Potassium Critical Value: 6.4  Name of Provider Notified: Micheline Rough, NP  Orders Received? Or Actions Taken?: Orders Received - See Orders for details

## 2020-08-25 NOTE — Progress Notes (Addendum)
NAME:  Vincent [name], MRN:  [id number], DOB:  26-Apr-1961, LOS: 4 ADMISSION DATE:  09/03/2020 60 yo male who presented to [facility] ER via EMS post cardiac arrest on 04/6. Per ER notes EMS reported the pt was seated at a local bar when he suddenly became unresponsive and fell off a barstool. EMS and first responders notified and upon their arrival pt pulseless, CPR started and AED applied. Pt required 1 shock, and ROSC achieved immediately. EMS inserted a King airway in the field. Code STEMI initiated. En route to the ER pt became agitated and delirious, therefore EMS administered versed. However, despite versed the pt ripped his Edison Pace airway out.  ED course Upon arrival to the ER pt remained altered and agitated with NRB in place and unable to redirect. Therefore, pt required intubation with mechanical ventilator support.EKG suggestive of ST elevation anteriorly.ER physician contacted on call Cardiologist Dr. Clayborn Bigness and pt transported to cath lab for emergent cardiac catheterization.  Cardiac cath revealed 100% occluded proximal LAD IRA requiring PCI and stent DES overlapping 2 stent to the proximal to mid LAD.Post procedure pt admitted to ICU [facility] team consulted to assist with management.  Pertinent Medical History  Varicose Veins PVD Lymphedema HTN Heartburn DVT CAD COPD CKD FormerETOH Abuse  Significant Hospital Events: Including procedures, antibiotic start and stop dates in addition to other pertinent events   4/6 with anterior STEMI requiring emergent mechanical intubation and cardiac cath   Pt incidentally found to be COVID-19 positive  Remdesivir 04/6>>  Left IJ CVL 04/7>>  4/7 multiorgan failure, severe hypoxia CT HEAD NEG  4/8 severe hypoxia, patient made DNR status  4/9 severe hypoxia, vent support, vasopressors, acute afib with RVRF  4/10 persistent severe hypoxia, unable to prone due to cardiac instability, increasing FiO2  requirement   Micro Data:  COVID 19 + INF A/B NEG 4/6 RESP >> normal flora   Interim History / Subjective:  Remains intubated Severe hypoxia now with increasing FiO2 requirement Pressor dependent Patient with life threatening multiorgan failure +vasculopathy Acute afiob with R VR-started hep/amio per cardiology   Objective   Blood pressure 121/61, pulse 60, temperature 98.2 F (36.8 C), resp. rate (!) 23, height $RemoveBe'5\' 8"'xilfLKAwf$  (1.727 m), weight 98.6 kg, SpO2 93 %.    Vent Mode: PRVC FiO2 (%):  [60 %-100 %] 90 % Set Rate:  [30 bmp] 30 bmp Vt Set:  [470 mL] 470 mL PEEP:  [14 cmH20] 14 cmH20 Plateau Pressure:  [33 cmH20] 33 cmH20   Intake/Output Summary (Last 24 hours) at 08/25/2020 2205 Last data filed at 08/25/2020 2000 Gross per 24 hour  Intake 1476.38 ml  Output 7 ml  Net 1469.38 ml   Filed Weights   08/20/2020 2057 08/24/20 0445 08/25/20 0500  Weight: 113.4 kg 98.3 kg 98.6 kg     REVIEW OF SYSTEMS Unable to obtain due to being intubated and sedated.  PHYSICAL EXAMINATION: Physical examination is limited due to need for PPE/CAPR GENERAL:critically ill appearing, synchronous with the ventilator  HEAD: Normocephalic, atraumatic.  EYES: Pupils equal, round, reactive to light.  No scleral icterus.  MOUTH: Orotracheally intubated, OG in place. NECK: Supple.  Trachea midline.  No crepitus. PULMONARY: Coarse breath sounds, rhonchi throughout CARDIOVASCULAR: Monitor shows sinus no pauses noted. GASTROINTESTINAL: Protuberant, nondistended, soft. MUSCULOSKELETAL: Anasarca 3+. Mottled RT leg. NEUROLOGIC: Heavily sedated to maintain ventilator synchrony no further assessment possible. SKIN: Mottled right leg as noted.  No rashes otherwise  No Known Allergies  Anti-infectives (From admission, onward)  Start     Dose/Rate Route Frequency Ordered Stop   08/22/20 1000  remdesivir 100 mg in sodium chloride 0.9 % 100 mL IVPB  Status:  Discontinued       "Followed by" Linked  Group Details   100 mg 200 mL/hr over 30 Minutes Intravenous Daily 09/06/2020 2304 08/22/20 1025   08/22/20 0000  remdesivir 200 mg in sodium chloride 0.9% 250 mL IVPB       "Followed by" Linked Group Details   200 mg 580 mL/hr over 30 Minutes Intravenous Once 09/13/2020 2304 08/22/20 0145     Scheduled Meds: . amiodarone  150 mg Intravenous Once  . vitamin C  500 mg Per Tube Daily  . aspirin  81 mg Per Tube Daily  . chlorhexidine gluconate (MEDLINE KIT)  15 mL Mouth Rinse BID  . Chlorhexidine Gluconate Cloth  6 each Topical Daily  . docusate  100 mg Per Tube BID  . feeding supplement (PROSource TF)  45 mL Per Tube QID  . feeding supplement (VITAL AF 1.2 CAL)  1,000 mL Per Tube Q24H  . fentaNYL (SUBLIMAZE) injection  100 mcg Intravenous Once  . folic acid  1 mg Intravenous Daily  . free water  30 mL Per Tube Q4H  . hydrocortisone sod succinate (SOLU-CORTEF) inj  50 mg Intravenous Q6H  . insulin aspart  0-9 Units Subcutaneous Q4H  . insulin aspart  3 Units Subcutaneous Q4H  . mouth rinse  15 mL Mouth Rinse 10 times per day  . midodrine  5 mg Per Tube TID WC  . pantoprazole (PROTONIX) IV  40 mg Intravenous Q24H  . polyethylene glycol  17 g Per Tube Daily  . sodium chloride flush  10-40 mL Intracatheter Q12H  . sodium zirconium cyclosilicate  10 g Per Tube Daily  . thiamine injection  100 mg Intravenous Daily  . ticagrelor  90 mg Per Tube BID  . zinc sulfate  220 mg Per Tube Daily   Continuous Infusions: . sodium chloride Stopped (08/25/2020 2333)  . amiodarone 30 mg/hr (08/25/20 2215)  . fentaNYL infusion INTRAVENOUS 150 mcg/hr (08/25/20 2200)  . heparin 1,600 Units/hr (08/25/20 2200)  . midazolam Stopped (08/25/20 1428)  . norepinephrine (LEVOPHED) Adult infusion Stopped (08/25/20 0023)  . vasopressin 0.03 Units/min (08/23/20 0700)   PRN Meds:.acetaminophen **OR** acetaminophen, fentaNYL (SUBLIMAZE) injection, midazolam, ondansetron (ZOFRAN) IV, sodium chloride flush,  vecuronium   Labs   CBC: Recent Labs  Lab 08/20/2020 2052 08/22/20 0500 08/22/20 0941 08/23/20 0520 08/24/20 0527 08/25/20 0245  WBC 17.7* 20.7* 18.2* 15.5* 13.5* 15.7*  NEUTROABS 8.9* 19.2*  --  13.7* 12.2* 14.3*  HGB 12.5* 12.4* 12.9* 11.6* 10.8* 10.2*  HCT 39.6 39.0 40.7 36.8* 33.5* 32.6*  MCV 96.1 94.2 94.0 94.4 92.8 93.9  PLT 286 364 336 240 193 717    Basic Metabolic Panel: Recent Labs  Lab 09/06/2020 2047 09/05/2020 2052 08/22/20 0500 08/22/20 0941 08/23/20 0520 08/23/20 1410 08/23/20 1816 08/23/20 2323 08/24/20 0527 08/24/20 2018 08/25/20 0245  NA  --    < > 138 140 137  --   --  139 137  --  139  K  --    < > 5.3* 4.6 5.8*   < > 5.3* 5.6* 5.8* 6.2* 5.7*  CL  --    < > 102 103 101  --   --  101 100  --  98  CO2  --    < > _0 --   --  26 24  --  23  GLUCOSE  --    < > 248* 231* 243*  --   --  136* 166*  --  160*  BUN  --    < > 22 25* 46*  --   --  74* 80*  --  101*  CREATININE  --    < > 2.20* 2.53* 4.20*  --   --  6.36* 7.02*  --  9.16*  CALCIUM  --    < > 7.4* 7.7* 7.4*  --   --  7.1* 6.9*  --  7.0*  MG  --    < > 2.3  --  2.3  --   --  2.5* 2.8*  --  2.9*  PHOS 7.4*  --  7.4*  --  9.5*  --   --   --  12.0*  --  13.0*   < > = values in this interval not displayed.   GFR: Estimated Creatinine Clearance: 9.6 mL/min (A) (by C-G formula based on SCr of 9.16 mg/dL (H)). Recent Labs  Lab 08/22/20 0941 08/23/20 0520 08/23/20 1410 08/24/20 0527 08/25/20 0245  WBC 18.2* 15.5*  --  13.5* 15.7*  LATICACIDVEN  --   --  1.4  --   --     Liver Function Tests: Recent Labs  Lab 08/31/2020 2052 08/22/20 0500 08/23/20 0520 08/24/20 0527 08/25/20 0245  AST 73* 256* 1,532* 473* 317*  ALT 59* 288* 1,683* 1,193* 1,035*  ALKPHOS 96 98 94 86 93  BILITOT 0.8 0.8 0.5 0.8 0.5  PROT 6.2* 5.9* 5.9* 5.6* 5.8*  ALBUMIN 2.9* 2.6* 2.7* 2.6* 2.7*   No results for input(s): LIPASE, AMYLASE in the last 168 hours. No results for input(s): AMMONIA in the last 168  hours.  ABG    Component Value Date/Time   PHART 7.16 (LL) 08/22/2020 0538   PCO2ART 72 (HH) 08/22/2020 0538   PO2ART 119 (H) 08/22/2020 0538   HCO3 25.7 08/22/2020 0538   ACIDBASEDEF 4.4 (H) 08/22/2020 0538   O2SAT 97.5 08/22/2020 0538     Coagulation Profile: Recent Labs  Lab 09/07/2020 2052  INR 1.3*    Cardiac Enzymes: No results for input(s): CKTOTAL, CKMB, CKMBINDEX, TROPONINI in the last 168 hours.  HbA1C: Hemoglobin A1C  Date/Time Value Ref Range Status  03/05/2014 11:20 AM 6.2 4.2 - 6.3 % Final    Comment:    The American Diabetes Association recommends that a primary goal of therapy should be <7% and that physicians should reevaluate the treatment regimen in patients with HbA1c values consistently >8%.    Hgb A1c MFr Bld  Date/Time Value Ref Range Status  08/22/2020 09:41 AM 6.4 (H) 4.8 - 5.6 % Final    Comment:    (NOTE) Pre diabetes:          5.7%-6.4%  Diabetes:              >6.4%  Glycemic control for   <7.0% adults with diabetes     CBG: Recent Labs  Lab 08/25/20 0319 08/25/20 0807 08/25/20 1139 08/25/20 1637 08/25/20 2014  GLUCAP 177* 165* 125* 165* 58*     ASSESSMENT AND PLAN  SYNOPSIS 60 yo male with sudden cardiac arest with acute STEMI 100% LAD occlusion with ischemic cardiomyopathy with COVID 19 infection with severe hypoxic resp failure with progressive renal failure, patient with progressive multiorgan failure  Acute hypoxic hypercapnic respiratory failure post cardiac arrest and  incidental finding of [id number] complicated by  COPD  Mechanical intubation  Hx: Current everyday smoker Mechanical ventilation via ARDS protocol, target PRVC 6 cc/kg Wean PEEP and FiO2 as able to maintain O2 sats >88% Goal plateau pressure less than 30, driving pressure less than 15 Paralytics if necessary for vent synchrony, gas exchange Unable to prone position due to cardiac instability Deep sedation per PAD protocol, goal RASS -2 -3,  currentlyfentanyl, propofol  Diuresis will be difficult due to acute renal failure VAP prevention order set, continue Remdesivir, discontinued before 5 days due to severe hepatic dysfunction Steroids Follow inflammatory markers: Ferritin, D-dimer, CRP, IL-6, LDH Vitamin C, zinc Follow cultures  Trend WBC and monitor fever curve Trend PCT  Per patient's brother he would not want prolonged mechanical ventilation  Cardiac arrest  secondary to anterior STEMI due to 100% occluded proximal LAD IRA s/p PCI and placement of 2 stents  A. fib RVR Acute CHF exacerbation~EF less than 25%  Hx: PVD, HTN, DVT, and CAD Continuous telemetry monitoring  Trend troponin's Cardiology consulted appreciate input~cardiac meds per recommendations  Amiodarone for A. fib  Acute on chronic renal failure  Trend BMP  Replace electrolytes as indicated  Monitor UOP Avoid nephrotoxic medications Per patient's brother he would not want dialysis  Transaminitis likely secondary to cardiogenic shock  Cannot exclude alcoholic liver disease Trend hepatic function panel  General supportive care  Anemia without obvious acute blood loss Trend CBC  Monitor for s/sx of bleeding and transfuse for hgb <7  Acute encephalopathy post cardiac arrest  Mechanical ventilation pain/discomfort  Maintain RASS goal -2 to -3 for now Proporol gtt and prn fentanyl to maintain RASS goal  Will need CT Head once stable for transport  WUA has been deferred due to increased FiO2 requirements and severity of illness      Ischemic right leg Unfortunately not a candidate for interventions at this time High risk for limb loss Appreciate vascular surgery's input   Best practice (right click and "Reselect all SmartList Selections" daily)  Diet:  NPO Pain/Anxiety/Delirium protocol (if indicated): Yes (RASS goal -2,-3) VAP protocol (if indicated): Yes DVT prophylaxis: Systemic AC GI prophylaxis: H2B Glucose control:  SSI  Yes Central venous access:   Triple-lumen CVC, left IJ, still needed Arterial line:  N/A Foley:  Yes, and it is still needed Mobility:  bed rest  PT consulted: N/A Last date of multidisciplinary goals of care discussion: 4/9, discussed with brother Gwyndolyn Saxon over the phone. Code Status:  DNR Disposition: ICU    The patient is critically ill with multiple organ systems failure and requires high complexity decision making for assessment and support, frequent evaluation and titration of therapies, application of advanced monitoring technologies and extensive interpretation of multiple databases. Critical Care Time devoted to patient care services described in this note is 45 minutes.    Overall, patient is critically ill, prognosis is guarded.  Patient with multiorgan failure and at high risk for cardiac arrest and death.   Patient remains DNR status, prognosis is very poor.  LOS: 4   C. Derrill Kay, MD [facility]   *This note was dictated using voice recognition software/Dragon.  Despite best efforts to proofread, errors can occur which can change the meaning.  Any change was purely unintentional.

## 2020-08-25 NOTE — Progress Notes (Addendum)
ANTICOAGULATION CONSULT NOTE  Pharmacy Consult for heparin infusion Indication: atrial fibrillation  No Known Allergies  Patient Measurements: Height: 5\' 8"  (172.7 cm) Weight: 98.3 kg (216 lb 11.4 oz) IBW/kg (Calculated) : 68.4 Heparin Dosing Weight: 93.9 kg  Vital Signs: Temp: 98.24 F (36.8 C) (04/09 2300) Temp Source: Esophageal (04/09 2300) BP: 112/67 (04/09 2300) Pulse Rate: 59 (04/09 2300)  Labs: Recent Labs    08/22/20 0941 08/22/20 1257 08/23/20 0520 08/23/20 2323 08/24/20 0527 08/24/20 1301 08/24/20 2213  HGB 12.9*  --  11.6*  --  10.8*  --   --   HCT 40.7  --  36.8*  --  33.5*  --   --   PLT 336  --  240  --  193  --   --   HEPARINUNFRC  --   --   --   --  0.10* 0.41 0.43  CREATININE 2.53*  --  4.20* 6.36* 7.02*  --   --   TROPONINIHS 4,109* 4,276*  --   --   --   --   --     Estimated Creatinine Clearance: 12.6 mL/min (A) (by C-G formula based on SCr of 7.02 mg/dL (H)).   Medical History: Past Medical History:  Diagnosis Date  . Alcoholic (HCC)   . Chronic kidney disease   . COPD (chronic obstructive pulmonary disease) (HCC)   . Coronary artery disease   . DVT (deep venous thrombosis) (HCC)   . Heartburn   . Hypertension   . Lymphedema   . Peripheral vascular disease (HCC)   . Varicose veins     Medications:   Pt ordered Brilinta 90mg  BID post Coronary Intervention Pt ordered Heparin 5000 units q8hr w/ last dose 4/8 @ 2159.  Assessment: Pt is 60 yo male with significant cardiovascular, pulmonary, & kidney disease now in Afib.  NP consulting pharmacy to dose therapeutic heparin.  Due to concomitant therapy with Brilinta, pharmacy asked to target lower end of therapeutic range and monitor platelets closely.  4/9 1301 HL 0.41  4/9 2213 HL 0.45  Goal of Therapy:  Heparin level 0.3-0.7 units/ml Monitor platelets by anticoagulation protocol: Yes   04/09 0527 HL 0.1, subtherapeutic   Plan:  Heparin level is therapeutic. Will continue  heparin infusion at 1600 units/hr. Recheck heparin level with AM labs, then daily. CBC daily while on heparin.    2214, PharmD, [facility] 08/25/2020 12:06 AM

## 2020-08-25 NOTE — Progress Notes (Addendum)
ANTICOAGULATION CONSULT NOTE  Pharmacy Consult for heparin infusion Indication: atrial fibrillation  No Known Allergies  Patient Measurements: Height: 5\' 8"  (172.7 cm) Weight: 98.6 kg (217 lb 6 oz) IBW/kg (Calculated) : 68.4 Heparin Dosing Weight: 93.9 kg  Vital Signs: Temp: 98.42 F (36.9 C) (04/10 0500) Temp Source: Esophageal (04/10 0500) BP: 106/57 (04/10 0500) Pulse Rate: 98 (04/10 0500)  Labs: Recent Labs    08/22/20 0941 08/22/20 1257 08/23/20 0520 08/23/20 0520 08/23/20 2323 08/24/20 0527 08/24/20 1301 08/24/20 2213 08/25/20 0245 08/25/20 0501  HGB 12.9*  --  11.6*  --   --  10.8*  --   --  10.2*  --   HCT 40.7  --  36.8*  --   --  33.5*  --   --  32.6*  --   PLT 336  --  240  --   --  193  --   --  190  --   HEPARINUNFRC  --   --   --    < >  --  0.10* 0.41 0.43  --  0.50  CREATININE 2.53*  --  4.20*  --  6.36* 7.02*  --   --  9.16*  --   TROPONINIHS 4,109* 4,276*  --   --   --   --   --   --   --   --    < > = values in this interval not displayed.    Estimated Creatinine Clearance: 9.6 mL/min (A) (by C-G formula based on SCr of 9.16 mg/dL (H)).   Medical History: Past Medical History:  Diagnosis Date  . Alcoholic (HCC)   . Chronic kidney disease   . COPD (chronic obstructive pulmonary disease) (HCC)   . Coronary artery disease   . DVT (deep venous thrombosis) (HCC)   . Heartburn   . Hypertension   . Lymphedema   . Peripheral vascular disease (HCC)   . Varicose veins     Medications:   Pt ordered Brilinta 90mg  BID post Coronary Intervention Pt ordered Heparin 5000 units q8hr w/ last dose 4/8 @ 2159.  Assessment: Pt is 60 yo male with significant cardiovascular, pulmonary, & kidney disease now in Afib.  NP consulting pharmacy to dose therapeutic heparin.  Due to concomitant therapy with Brilinta, pharmacy asked to target lower end of therapeutic range and monitor platelets closely.  4/9 1301 HL 0.41  4/9 2213 HL 0.45 4/10 0501 HL  0.5  Goal of Therapy:  Heparin level 0.3-0.7 units/ml Monitor platelets by anticoagulation protocol: Yes     Plan:  Heparin level is therapeutic x 3 Will continue heparin infusion at 1600 units/hr. Recheck heparin level daily with AM labs. CBC daily while on heparin.    6/9, PharmD, [facility] 08/25/2020 6:32 AM

## 2020-08-25 NOTE — Progress Notes (Signed)
NEURO: Pt unresponsive upon shift assessment -> Pt transitioned off versed and fentanyl titrated down. Upon reassessment pt was only able to slightly open eyes to sternal rub. Pt does not follow commands/track. Corneals are intact, cough is present, no gag noted. Afebrile. CPOT 0-1 -> fent gtt.  PULM: Pt has remained on PRVC. FiO2 increased during shift to 100% d/t desaturation to low 80s. Lung sounds are diminished to auscultation. Minimal thin, clear secretions from inline.   CARDIAC: Pt has remained in sinus brady with multiple pauses, BBB. [place] Foley as low as low 30s on cardiac monitor. Amiodarone/heparin gtt remains.  GI/GU: Pt has remained severely oliguric with UO 20ml. Last BM is still PTA. TF vital AF remains at trickle 54ml rate.   [place] [facility] notified of pt deterioration and is not a candidate for organ donation at this time.   Girl friend, Carollee Herter and friend, Arline Asp from [place] updated via telephone.

## 2020-08-26 ENCOUNTER — Encounter: Payer: Self-pay | Admitting: Internal Medicine

## 2020-08-26 ENCOUNTER — Inpatient Hospital Stay: Payer: Self-pay

## 2020-08-26 DIAGNOSIS — N179 Acute kidney failure, unspecified: Secondary | ICD-10-CM

## 2020-08-26 DIAGNOSIS — R7401 Elevation of levels of liver transaminase levels: Secondary | ICD-10-CM

## 2020-08-26 DIAGNOSIS — Z978 Presence of other specified devices: Secondary | ICD-10-CM

## 2020-08-26 DIAGNOSIS — J9601 Acute respiratory failure with hypoxia: Secondary | ICD-10-CM

## 2020-08-26 DIAGNOSIS — U071 COVID-19: Secondary | ICD-10-CM

## 2020-08-26 DIAGNOSIS — F17200 Nicotine dependence, unspecified, uncomplicated: Secondary | ICD-10-CM

## 2020-08-26 DIAGNOSIS — M62261 Nontraumatic ischemic infarction of muscle, right lower leg: Secondary | ICD-10-CM

## 2020-08-26 DIAGNOSIS — I4891 Unspecified atrial fibrillation: Secondary | ICD-10-CM

## 2020-08-26 DIAGNOSIS — I509 Heart failure, unspecified: Secondary | ICD-10-CM

## 2020-08-26 LAB — MAGNESIUM: Magnesium: 3.1 mg/dL — ABNORMAL HIGH (ref 1.7–2.4)

## 2020-08-26 LAB — BLOOD GAS, ARTERIAL
Acid-base deficit: 7.8 mmol/L — ABNORMAL HIGH (ref 0.0–2.0)
Bicarbonate: 20.6 mmol/L (ref 20.0–28.0)
FIO2: 0.9
MECHVT: 470 mL
O2 Saturation: 93.3 %
PEEP: 14 cmH2O
Patient temperature: 37
pCO2 arterial: 54 mmHg — ABNORMAL HIGH (ref 32.0–48.0)
pH, Arterial: 7.19 — CL (ref 7.350–7.450)
pO2, Arterial: 84 mmHg (ref 83.0–108.0)

## 2020-08-26 LAB — CBC WITH DIFFERENTIAL/PLATELET
Abs Immature Granulocytes: 0.44 10*3/uL — ABNORMAL HIGH (ref 0.00–0.07)
Basophils Absolute: 0 10*3/uL (ref 0.0–0.1)
Basophils Relative: 0 %
Eosinophils Absolute: 0 10*3/uL (ref 0.0–0.5)
Eosinophils Relative: 0 %
HCT: 33.6 % — ABNORMAL LOW (ref 39.0–52.0)
Hemoglobin: 10.9 g/dL — ABNORMAL LOW (ref 13.0–17.0)
Immature Granulocytes: 3 %
Lymphocytes Relative: 2 %
Lymphs Abs: 0.3 10*3/uL — ABNORMAL LOW (ref 0.7–4.0)
MCH: 29.8 pg (ref 26.0–34.0)
MCHC: 32.4 g/dL (ref 30.0–36.0)
MCV: 91.8 fL (ref 80.0–100.0)
Monocytes Absolute: 0.9 10*3/uL (ref 0.1–1.0)
Monocytes Relative: 5 %
Neutro Abs: 16.1 10*3/uL — ABNORMAL HIGH (ref 1.7–7.7)
Neutrophils Relative %: 90 %
Platelets: 205 10*3/uL (ref 150–400)
RBC: 3.66 MIL/uL — ABNORMAL LOW (ref 4.22–5.81)
RDW: 14.2 % (ref 11.5–15.5)
WBC: 17.8 10*3/uL — ABNORMAL HIGH (ref 4.0–10.5)
nRBC: 0.4 % — ABNORMAL HIGH (ref 0.0–0.2)

## 2020-08-26 LAB — COMPREHENSIVE METABOLIC PANEL
ALT: 766 U/L — ABNORMAL HIGH (ref 0–44)
AST: 173 U/L — ABNORMAL HIGH (ref 15–41)
Albumin: 2.9 g/dL — ABNORMAL LOW (ref 3.5–5.0)
Alkaline Phosphatase: 108 U/L (ref 38–126)
Anion gap: 20 — ABNORMAL HIGH (ref 5–15)
BUN: 133 mg/dL — ABNORMAL HIGH (ref 8–23)
CO2: 22 mmol/L (ref 22–32)
Calcium: 6.6 mg/dL — ABNORMAL LOW (ref 8.9–10.3)
Chloride: 96 mmol/L — ABNORMAL LOW (ref 98–111)
Creatinine, Ser: 10.89 mg/dL — ABNORMAL HIGH (ref 0.61–1.24)
GFR, Estimated: 5 mL/min — ABNORMAL LOW (ref >60)
Glucose, Bld: 153 mg/dL — ABNORMAL HIGH (ref 70–99)
Potassium: 6.2 mmol/L — ABNORMAL HIGH (ref 3.5–5.1)
Sodium: 138 mmol/L (ref 135–145)
Total Bilirubin: 0.5 mg/dL (ref 0.3–1.2)
Total Protein: 6.1 g/dL — ABNORMAL LOW (ref 6.5–8.1)

## 2020-08-26 LAB — FERRITIN: Ferritin: 1756 ng/mL — ABNORMAL HIGH (ref 24–336)

## 2020-08-26 LAB — HEPARIN LEVEL (UNFRACTIONATED): Heparin Unfractionated: 0.53 IU/mL (ref 0.30–0.70)

## 2020-08-26 LAB — D-DIMER, QUANTITATIVE: D-Dimer, Quant: 8.07 ug/mL-FEU — ABNORMAL HIGH (ref 0.00–0.50)

## 2020-08-26 LAB — GLUCOSE, CAPILLARY: Glucose-Capillary: 165 mg/dL — ABNORMAL HIGH (ref 70–99)

## 2020-08-26 LAB — PHOSPHORUS: Phosphorus: 15.8 mg/dL — ABNORMAL HIGH (ref 2.5–4.6)

## 2020-08-26 MED ORDER — GLYCOPYRROLATE 1 MG PO TABS
1.0000 mg | ORAL_TABLET | ORAL | Status: DC | PRN
  Filled 2020-08-26: qty 1

## 2020-08-26 MED ORDER — MIDAZOLAM HCL 2 MG/2ML IJ SOLN
1.0000 mg | INTRAMUSCULAR | Status: DC | PRN
Start: 2020-08-26 — End: 2020-08-26

## 2020-08-26 MED ORDER — GLYCOPYRROLATE 0.2 MG/ML IJ SOLN: 0.2000 mg | INTRAMUSCULAR | Status: DC | PRN

## 2020-08-26 MED ORDER — ACETAMINOPHEN 650 MG RE SUPP: 650.0000 mg | Freq: Four times a day (QID) | RECTAL | Status: DC | PRN

## 2020-08-26 MED ORDER — DIPHENHYDRAMINE HCL 50 MG/ML IJ SOLN: 25.0000 mg | INTRAMUSCULAR | Status: DC | PRN

## 2020-08-26 MED ORDER — POLYVINYL ALCOHOL 1.4 % OP SOLN
1.0000 [drp] | Freq: Four times a day (QID) | OPHTHALMIC | Status: DC | PRN
  Filled 2020-08-26: qty 15

## 2020-08-26 MED ORDER — DEXTROSE 5 % IV SOLN
INTRAVENOUS | Status: DC
  Administered 2020-08-26: 20 mL/h via INTRAVENOUS

## 2020-09-15 NOTE — Progress Notes (Signed)
Patient assessment remains unchanged throughout shift. Daily wake up not initiated at this time per NP. Will continue to monitor.

## 2020-09-15 NOTE — Death Summary Note (Signed)
DEATH SUMMARY   Patient Details  Name: Vincent [name] MRN: [id number] DOB: [birth year]  Admission/Discharge Information   Admit Date:  2020-08-28  Date of Death: Date of Death: 2020-09-02  Time of Death: Time of Death: 0807  Length of Stay: 5  Referring Physician: Verlee Monte, PA-C   Reason(s) for Hospitalization  OOH cardiac arrest secondary to LAD STEMI Acute hypoxemic respiratory failure Ischemic right lower extremity Acute renal failure Acute liver failure Post cardiac arrest encephalopathy COVID positive Hx PAD, PVD, DVT, CAD, COPD, CKD  Brief Hospital Course (including significant findings, care, treatment, and services provided and events leading to death)  60 yo male who presented to [facility] ER via EMS post cardiac arrest on 08-29-22. Per ER notes EMS reported the pt was seated at a local bar when he suddenly became unresponsive and fell off a barstool. EMS and first responders notified and upon their arrival pt pulseless, CPR started and AED applied. Pt required 1 shock, and ROSC achieved immediately. EMS inserted a King airway in the field. Code STEMI initiated. En route to the ER pt became agitated and delirious, therefore EMS administered versed. However, despite versed the pt ripped his Brooke Dare airway out.  ED course Upon arrival to the ER pt remained altered and agitated with NRB in place and unable to redirect. Therefore, pt required intubation with mechanical ventilator support.EKG suggestive of ST elevation anteriorly.ER physician contacted on call Cardiologist Dr. Juliann Pares and pt transported to cath lab for emergent cardiac catheterization.  Cardiac cath revealed 100% occluded proximal LAD IRA requiring PCI and stent DES overlapping 2 stent to the proximal to mid LAD.Post procedure pt admitted to ICU [facility] team consulted to assist with management.  Developed progressive irreversible multiorgan failure in ICU despite aggressive care including mechanical  ventilation and pressors.  Progressive renal failure ensured and family knew patient would not have wanted HD.   In light of terminal prognosis family allowed him to pass in peace off ventilator.   Pertinent Labs and Studies  Significant Diagnostic Studies DG Abd 1 View  Result Date: 08/22/2020 CLINICAL DATA:  Orogastric tube placement. EXAM: ABDOMEN - 1 VIEW COMPARISON:  Chest x-ray 08/22/2020. FINDINGS: Orogastric tube noted with tip over the stomach. Mild gastric distention. No bowel distention. No free air. Contrast noted in the kidneys. Degenerative changes and scoliosis thoracic spine. IMPRESSION: Orogastric tube noted with tip over the stomach. Mild gastric distention. Electronically Signed   By: Maisie Fus  Register   On: 08/22/2020 11:43   CT HEAD WO CONTRAST  Result Date: 08/22/2020 CLINICAL DATA:  60 year old male status post cardiac arrest, STEMI. EXAM: CT HEAD WITHOUT CONTRAST TECHNIQUE: Contiguous axial images were obtained from the base of the skull through the vertex without intravenous contrast. COMPARISON:  Brain MRI 08/19/2016. FINDINGS: Brain: No midline shift, mass effect, or evidence of intracranial mass lesion. No ventriculomegaly. No acute intracranial hemorrhage identified. No cortically based acute infarct identified. Gray-white matter differentiation appears symmetric and within normal limits throughout the brain. Normal basilar cisterns. Vascular: Extensive Calcified atherosclerosis at the skull base. Intravascular contrast appears to be present. No suspicious intracranial vascular hyperdensity. Skull: No acute osseous abnormality identified. Sinuses/Orbits: Scattered sinus fluid, mucosal thickening and opacification. Fluid in the nasal cavity and nasopharynx. Intubated. Tympanic cavities and mastoids remain well aerated. Other: Generalized scalp soft tissue edema. Right anterior vertex broad-based scalp hematoma. Incidental small benign left vertex scalp lipoma. IMPRESSION: 1.  Intravascular contrast suspected. Normal CT appearance of the brain. 2. Generalized scalp soft tissue  edema and right vertex scalp hematoma. 3. Intubated.  Fluid in the pharynx, paranasal sinuses. Electronically Signed   By: Odessa Fleming M.D.   On: 08/22/2020 06:19   CARDIAC CATHETERIZATION  Result Date: 08/22/2020  Prox RCA to Mid RCA lesion is 70% stenosed.  Prox Cx lesion is 25% stenosed.  Prox LAD lesion is 100% stenosed.  A drug-eluting stent was successfully placed using a STENT RESOLUTE ONYX 2.5X30.  Post intervention, there is a 0% residual stenosis.  Mid LAD lesion is 75% stenosed.  A drug-eluting stent was successfully placed using a STENT RESOLUTE ONYX 2.25X30.  Post intervention, there is a 0% residual stenosis.  Conclusion conclusion STEMI presentation with sudden death respiratory failure intubated sedated severely depressed left ventricular function globally ejection fraction less than 25%  Conclusion STEMI presentation anterior STEMI Complicated by sudden death respiratory failure intubated sedated Diagnostic procedure Severely depressed left ventricular function of less than 25% globally with moderate left ventricular enlargement Coronaries Left main large free of disease LAD large with 100% proximal occlusion IRA TIMI 0 flow Circumflex large with minor irregularities RCA large with moderate to severe mid disease TIMI-3 flow Intervention Successful PCI and stent of proximal and mid LAD with overlapping stents DES resolute Onyx Proximal 2.5 x 30 mm mid 2.25 x 30 mm Lesion reduced from 100% down to 0 proximally and 75% down to 0 mid TIMI 0 was transformed to TIMI-3 flow by the end of the case Minx was deployed in the left femoral artery Cangrelor infusion for 2 hours Angiomax at reduced rate for an additional 4 hours Aspirin 325 and Brilinta 180 down the NG Amiodarone IV post arrest load and infusion Patient was maintained on the vent intubated sedated Patient was transported to intensive care  for critical care management No complications  US ARTERIAL LOWER EXTREMITY DUPLEX RIGHT(NON-ABI)  Result Date: 08/22/2020 CLINICAL DATA:  60 year old male with cyanosis EXAM: UNILATERAL LOWER EXTREMITY ARTERIAL DUPLEX SCAN TECHNIQUE: Gray-scale sonography as well as color Doppler and duplex ultrasound was performed to evaluate the arteries of SINGLE lower EXTREMITY including the common, superficial and profunda femoral arteries, popliteal artery and calf arteries. COMPARISON:  None. FINDINGS: Right Lower Extremity Directed duplex demonstrates monophasic waveform of common femoral artery, profunda femoris. SFA is occluded to the popliteal artery. Reconstitution of the popliteal artery with monophasic waveform. Monophasic tibial arteries. Left Lower Extremity Directed duplex left lower extremity demonstrates monophasic common femoral artery IMPRESSION: Directed duplex right lower extremity demonstrates monophasic common femoral artery, occlusion of the SFA, and reconstitution of the popliteal artery. Monophasic waveform of the tibial arteries. Signed, Yvone Neu. Reyne Dumas, RPVI [facility] Electronically Signed   By: Gilmer Mor D.O.   On: 08/22/2020 14:34   DG Chest Port 1 View  Result Date: 09/01/2020 CLINICAL DATA:  Respiratory failure EXAM: PORTABLE CHEST 1 VIEW COMPARISON:  08/22/2020 FINDINGS: Endotracheal tube 8.9 cm above the carina, nasogastric tube extending into the upper abdomen, and left internal jugular central venous catheter with its tip overlying the left brachiocephalic vein are unchanged. Pulmonary insufflation is normal. Previous identified extensive interstitial pulmonary infiltrate has improved in keeping with improving pulmonary edema. Residual trace interstitial infiltrate is seen within the upper lobes bilaterally. No pneumothorax or pleural effusion. Cardiac size is within normal limits. IMPRESSION: Stable support lines and  tubes. Stable pulmonary insufflation. Improving interstitial pulmonary edema, now very mild in severity. Electronically Signed   By: Helyn Numbers MD   On: 09/01/2020 03:41  DG Chest Port 1 View  Result Date: 08/22/2020 CLINICAL DATA:  Central line placement EXAM: PORTABLE CHEST 1 VIEW COMPARISON:  08-23-2020 FINDINGS: Left central line is in place with the tip in the upper SVC. No pneumothorax. Endotracheal tube and NG tube are unchanged. Diffuse bilateral airspace disease, worsening on the left since prior study. IMPRESSION: Left central line tip in the upper SVC.  No pneumothorax. Diffuse bilateral airspace disease, worsening on the left since prior study Electronically Signed   By: Charlett Nose M.D.   On: 08/22/2020 01:03   DG Chest Portable 1 View  Result Date: August 23, 2020 CLINICAL DATA:  Status post intubation.  Field STEMI. EXAM: PORTABLE CHEST 1 VIEW COMPARISON:  Chest x-ray 03/05/2014 FINDINGS: Endotracheal tube with tip approximately 3 cm above the carina. Enteric tube coursing below the hemidiaphragm with tip collimated off view and side port overlying the gastric lumen. The heart size and mediastinal contours are unchanged. Aortic arch calcifications. No focal consolidation. Increased interstitial markings. Right costophrenic angle collimated off view. No pleural effusion. No pneumothorax. No acute osseous abnormality. IMPRESSION: Endotracheal tube and enteric tube in grossly appropriate position. Pulmonary edema. Electronically Signed   By: Tish Frederickson M.D.   On: 23-Aug-2020 21:14   ECHOCARDIOGRAM COMPLETE  Result Date: 08/22/2020    ECHOCARDIOGRAM REPORT   Patient Name:   Vincent [name] Date of Exam: 08/22/2020 Medical Rec #:  [id number]       Height:       68.0 in Accession #:    [id number]      Weight:       250.0 lb Date of Birth:  11-Sep-1960       BSA:          2.247 m Patient Age:    60 years        BP:           102/58 mmHg Patient Gender: M               HR:           62 bpm. Exam  Location:  [facility] Procedure: 2D Echo, Color Doppler, Cardiac Doppler and Intracardiac            Opacification Agent Indications:     I21.9 Myocardial Infarct  History:         Patient has no prior history of Echocardiogram examinations.                  CAD, COPD and CKD; Risk Factors:Hypertension. Pt tested                  positive for COVID-19 on Aug 23, 2020.  Sonographer:     Humphrey Rolls RDCS (AE) Referring Phys:  [id number] [facility] D CALLWOOD Diagnosing Phys: Harold Hedge MD  Sonographer Comments: Suboptimal parasternal window and echo performed with patient supine and on artificial respirator. Image acquisition challenging due to COPD. Global longitudinal strain was attempted. IMPRESSIONS  1. Probable small apical thrombus. Left ventricular ejection fraction, by estimation, is 20 to 25%. The left ventricle has severely decreased function. The left ventricle demonstrates regional wall motion abnormalities (see scoring diagram/findings for description). The left ventricular internal cavity size was mildly dilated. Left ventricular diastolic parameters were normal.  2. Right ventricular systolic function is normal. The right ventricular size is normal.  3. The mitral valve is grossly normal. Mild mitral valve regurgitation.  4. The aortic valve is grossly normal. Aortic valve regurgitation is not visualized. FINDINGS  Left Ventricle: Probable small apical thrombus. Left ventricular ejection fraction, by estimation, is 20 to 25%. The left ventricle has severely decreased function. The left ventricle demonstrates regional wall motion abnormalities. Definity contrast agent was given IV to delineate the left ventricular endocardial borders. The left ventricular internal cavity size was mildly dilated. There is borderline left ventricular hypertrophy. Left ventricular diastolic parameters were normal.  LV Wall Scoring: The apex is akinetic. The entire anterior wall, mid and distal lateral wall, entire anterior septum, entire  inferior wall, posterior wall, mid anterolateral segment, and mid inferoseptal segment are hypokinetic. Right Ventricle: The right ventricular size is normal. No increase in right ventricular wall thickness. Right ventricular systolic function is normal. Left Atrium: Left atrial size was normal in size. Right Atrium: Right atrial size was normal in size. Pericardium: There is no evidence of pericardial effusion. Mitral Valve: The mitral valve is grossly normal. Mild mitral valve regurgitation. MV peak gradient, 2.8 mmHg. The mean mitral valve gradient is 1.0 mmHg. Tricuspid Valve: The tricuspid valve is not well visualized. Tricuspid valve regurgitation is trivial. Aortic Valve: The aortic valve is grossly normal. Aortic valve regurgitation is not visualized. Aortic valve mean gradient measures 2.0 mmHg. Aortic valve peak gradient measures 2.7 mmHg. Aortic valve area, by VTI measures 3.20 cm. Pulmonic Valve: The pulmonic valve was not well visualized. Pulmonic valve regurgitation is trivial. Aorta: The aortic root is normal in size and structure. IAS/Shunts: The interatrial septum was not well visualized.  LEFT VENTRICLE PLAX 2D LVOT diam:     2.30 cm      Diastology LV SV:         41           LV e' medial:    5.66 cm/s LV SV Index:   18           LV E/e' medial:  14.9 LVOT Area:     4.15 cm     LV e' lateral:   10.00 cm/s                             LV E/e' lateral: 8.4  LV Volumes (MOD) LV vol d, MOD A2C: 76.7 ml LV vol d, MOD A4C: 158.0 ml LV vol s, MOD A2C: 55.7 ml LV vol s, MOD A4C: 136.0 ml LV SV MOD A2C:     21.0 ml LV SV MOD A4C:     158.0 ml LV SV MOD BP:      23.6 ml RIGHT VENTRICLE RV Basal diam:  2.80 cm LEFT ATRIUM             Index       RIGHT ATRIUM           Index LA Vol (A2C):   35.4 ml 15.75 ml/m RA Area:     10.10 cm LA Vol (A4C):   36.2 ml 16.11 ml/m RA Volume:   20.00 ml  8.90 ml/m LA Biplane Vol: 39.6 ml 17.62 ml/m  AORTIC VALVE AV Area (Vmax):    3.79 cm AV Area (Vmean):   3.71 cm AV  Area (VTI):     3.20 cm AV Vmax:           81.60 cm/s AV Vmean:          57.400 cm/s AV VTI:            0.127 m AV Peak Grad:      2.7  mmHg AV Mean Grad:      2.0 mmHg LVOT Vmax:         74.40 cm/s LVOT Vmean:        51.300 cm/s LVOT VTI:          0.098 m LVOT/AV VTI ratio: 0.77  AORTA Ao Root diam: 3.10 cm MITRAL VALVE MV Area (PHT): 3.53 cm    SHUNTS MV Area VTI:   1.74 cm    Systemic VTI:  0.10 m MV Peak grad:  2.8 mmHg    Systemic Diam: 2.30 cm MV Mean grad:  1.0 mmHg MV Vmax:       0.83 m/s MV Vmean:      35.6 cm/s MV Decel Time: 215 msec MV E velocity: 84.40 cm/s MV A velocity: 30.00 cm/s MV E/A ratio:  2.81 Harold HedgeKenneth Fath MD Electronically signed by Harold HedgeKenneth Fath MD Signature Date/Time: 08/22/2020/4:52:09 PM    Final     Microbiology Recent Results (from the past 240 hour(s))  Resp Panel by RT-PCR (Flu A&B, Covid) Nasopharyngeal Swab     Status: Abnormal   Collection Time: 2020-10-24  8:51 PM   Specimen: Nasopharyngeal Swab; Nasopharyngeal(NP) swabs in vial transport medium  Result Value Ref Range Status   SARS Coronavirus 2 by RT PCR POSITIVE (A) NEGATIVE Final    Comment: RESULT CALLED TO, READ BACK BY AND VERIFIED WITH: Ardis Rowanikki Solomon @2146  on 08/18/2020 skl (NOTE) SARS-CoV-2 target nucleic acids are DETECTED.  The SARS-CoV-2 RNA is generally detectable in upper respiratory specimens during the acute phase of infection. Positive results are indicative of the presence of the identified virus, but do not rule out bacterial infection or co-infection with other pathogens not detected by the test. Clinical correlation with patient history and other diagnostic information is necessary to determine patient infection status. The expected result is Negative.  Fact Sheet for Patients: BloggerCourse.comhttps://www.fda.gov/media/152166/download  Fact Sheet for Healthcare Providers: SeriousBroker.ithttps://www.fda.gov/media/152162/download  This test is not yet approved or cleared by the [place] FDA and  has been authorized  for detection and/or diagnosis of SARS-CoV-2 by FDA under an Emergency Use Authorization (EUA).  This EUA will remain in effect (meaning this test can be  used) for the duration of  the COVID-19 declaration under Section 564(b)(1) of the Act, 21 U.S.C. section 360bbb-3(b)(1), unless the authorization is terminated or revoked sooner.     Influenza A by PCR NEGATIVE NEGATIVE Final   Influenza B by PCR NEGATIVE NEGATIVE Final    Comment: (NOTE) The Xpert Xpress SARS-CoV-2/FLU/RSV plus assay is intended as an aid in the diagnosis of influenza from Nasopharyngeal swab specimens and should not be used as a sole basis for treatment. Nasal washings and aspirates are unacceptable for Xpert Xpress SARS-CoV-2/FLU/RSV testing.  Fact Sheet for Patients: BloggerCourse.comhttps://www.fda.gov/media/152166/download  Fact Sheet for Healthcare Providers: SeriousBroker.ithttps://www.fda.gov/media/152162/download  This test is not yet approved or cleared by the [place] FDA and has been authorized for detection and/or diagnosis of SARS-CoV-2 by FDA under an Emergency Use Authorization (EUA). This EUA will remain in effect (meaning this test can be used) for the duration of the COVID-19 declaration under Section 564(b)(1) of the Act, 21 U.S.C. section 360bbb-3(b)(1), unless the authorization is terminated or revoked.  Performed at [facility], [address] [place], [place] [address]   Culture, Respiratory w Gram Stain     Status: None   Collection Time: 08/22/20  1:02 AM   Specimen: Tracheal Aspirate; Respiratory  Result Value Ref Range Status   Specimen  Description   Final    TRACHEAL ASPIRATE Performed at [facility], [address] [place], [place] [address]    Special Requests   Final    NONE Performed at [facility], [address] [place], [place] [address]    Gram Stain   Final    RARE WBC PRESENT, PREDOMINANTLY PMN FEW GRAM POSITIVE COCCI    Culture   Final    MODERATE  Normal respiratory flora-no Staph aureus or Pseudomonas seen Performed at [facility], [address] [place], [place] [address]    Report Status 08/24/2020 FINAL  Final    Lab Basic Metabolic Panel: Recent Labs  Lab 08/22/20 0500 08/22/20 0941 08/23/20 0520 08/23/20 1410 08/23/20 2323 08/24/20 0527 08/24/20 2018 08/25/20 0245 08/25/20 2156 09/23/2020 0404  NA 138   < > 137  --  139 137  --  139  --  138  K 5.3*   < > 5.8*   < > 5.6* 5.8* 6.2* 5.7* 6.4* 6.2*  CL 102   < > 101  --  101 100  --  98  --  96*  CO2 27   < > 24  --  26 24  --  23  --  22  GLUCOSE 248*   < > 243*  --  136* 166*  --  160*  --  153*  BUN 22   < > 46*  --  74* 80*  --  101*  --  133*  CREATININE 2.20*   < > 4.20*  --  6.36* 7.02*  --  9.16*  --  10.89*  CALCIUM 7.4*   < > 7.4*  --  7.1* 6.9*  --  7.0*  --  6.6*  MG 2.3  --  2.3  --  2.5* 2.8*  --  2.9*  --  3.1*  PHOS 7.4*  --  9.5*  --   --  12.0*  --  13.0*  --  15.8*   < > = values in this interval not displayed.   Liver Function Tests: Recent Labs  Lab 08/22/20 0500 08/23/20 0520 08/24/20 0527 08/25/20 0245 09/23/2020 0404  AST 256* 1,532* 473* 317* 173*  ALT 288* 1,683* 1,193* 1,035* 766*  ALKPHOS 98 94 86 93 108  BILITOT 0.8 0.5 0.8 0.5 0.5  PROT 5.9* 5.9* 5.6* 5.8* 6.1*  ALBUMIN 2.6* 2.7* 2.6* 2.7* 2.9*   No results for input(s): LIPASE, AMYLASE in the last 168 hours. No results for input(s): AMMONIA in the last 168 hours. CBC: Recent Labs  Lab 08/22/20 0500 08/22/20 0941 08/23/20 0520 08/24/20 0527 08/25/20 0245 2020-09-23 0404  WBC 20.7* 18.2* 15.5* 13.5* 15.7* 17.8*  NEUTROABS 19.2*  --  13.7* 12.2* 14.3* 16.1*  HGB 12.4* 12.9* 11.6* 10.8* 10.2* 10.9*  HCT 39.0 40.7 36.8* 33.5* 32.6* 33.6*  MCV 94.2 94.0 94.4 92.8 93.9 91.8  PLT 364 336 240 193 190 205   Cardiac Enzymes: No results for input(s): CKTOTAL, CKMB, CKMBINDEX, TROPONINI in the last 168 hours. Sepsis Labs: Recent Labs  Lab 08/23/20 0520 08/23/20 1410  08/24/20 0527 08/25/20 0245 Sep 23, 2020 0404  WBC 15.5*  --  13.5* 15.7* 17.8*  LATICACIDVEN  --  1.4  --   --   --     Lorin Glass 08/27/2020, 5:11 PM

## 2020-09-15 NOTE — Progress Notes (Signed)
08/22/2020   I have seen and evaluated the patient for comfort care.  S:  Extubated to comfort care this AM. Agonally breathing on fentanyl drip.  O: Blood pressure (!) 125/57, pulse 73, temperature 99.4 F (37.4 C), resp. rate (!) 25, height 5\' 8"  (1.727 m), weight 98.7 kg, SpO2 (!) 66 %.  Agonal breathing pattern Rattling upper airway sounds GCS 3 Diffuse edema  A:  Acute STEMI Ischemic cardiomyopathy Cardiorenal and septic ATN causing renal failure COPD, COVID ARDS  P:  - Fentanyl titrated to comfort - Death expected within hours   MD [facility] Prefer epic messenger for cross cover needs If after hours, please call E-link

## 2020-09-15 NOTE — Progress Notes (Signed)
Belongings documented upon patient being placed in morgue. See progress note for complete list of belongings.

## 2020-09-15 NOTE — Progress Notes (Signed)
Patient terminally extubated by RRT. Placed on 3L [place]. Fentanyl infusing for comfort. Will continue to monitor.

## 2020-09-15 NOTE — Progress Notes (Signed)
Patient belongings taken to morgue or security as follows: With patient: 1 pair black slide on shoes, jeans (cut), belt, shirt (cut), socks, cigarettes, lighter, glasses, dentures (upper and lower). In security: wisdom coin/button (Prov 2:6), [employer] poolplayers button/coin, Recovery coin, two dollar bill, one dollar bill, key, cell phone, wallet, DL, bank of [place] card, [employer] card, varo visa, my vanilla card visa/debit, $lisdean debit visa, green dot visa/debit.

## 2020-09-15 NOTE — Progress Notes (Signed)
Pt has been off Versed all day, Fentanyl being weaned. Sedation vacation did not improve neuro status. RASS remains -4/-5. NP aware. Advised to keep Fentanyl at current rate. Will continue to monitor.

## 2020-09-15 NOTE — Progress Notes (Signed)
Goals of Care  The Clinical status was relayed to the patient's brother, [name], via telephone in detail. Updated and notified of patients medical condition, including worsening respiratory failure and renal failure.  Patient transitioned to DNR status on 08/22/20, brother confirmed that the patient would not want his life prolonged in any way.  Discussed with [name] that the patient has Progressive multiorgan failure with very low chance of meaningful recovery despite optimal medical therapy. Patient is in the Dying Process associated with Suffering.  Family understands the situation.  [name] has consented and agreed to proceed with Comfort care measures.  Family are satisfied with Plan of action and management. All questions answered  Additional CC time 32 mins   Cheryll Cockayne Rust-Chester, AGACNP-BC [facility]    Please see Amion for pager details.

## 2020-09-15 NOTE — Progress Notes (Signed)
Patient pronounced dead at [phone] by writing RN and Rolene Arbour, RN.  [facility] notified of TOD, Dr Katrinka Blazing will complete death certificate Patient's brother [name] notified of TOD via phone by this RN.  [name] will call the patient's girlfriend.  [name] will be here tomorrow and will make funeral arrangements at that time.

## 2020-09-15 DEATH — deceased

## 2021-09-09 IMAGING — US US EXTREM DUPLEX ARTERIAL UNILATERAL
1 series · 14 of 25 positions shown · non-contrast
Comparison: None.

CLINICAL DATA: 60-year-old male with cyanosis

EXAM:
UNILATERAL LOWER EXTREMITY ARTERIAL DUPLEX SCAN
TECHNIQUE: Gray-scale sonography as well as color Doppler and duplex ultrasound
was performed to evaluate the arteries of SINGLE lower EXTREMITY
including the common, superficial and profunda femoral arteries,
popliteal artery and calf arteries.

[Series 1: us arterial lower extremity duplex right(non-abi) · arterial · 14 of 46 slices shown]
[im 1/46]
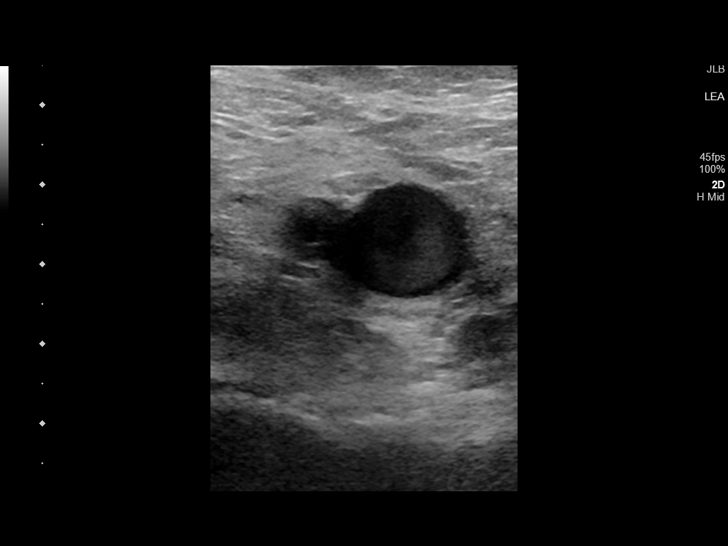
[im 4/46]
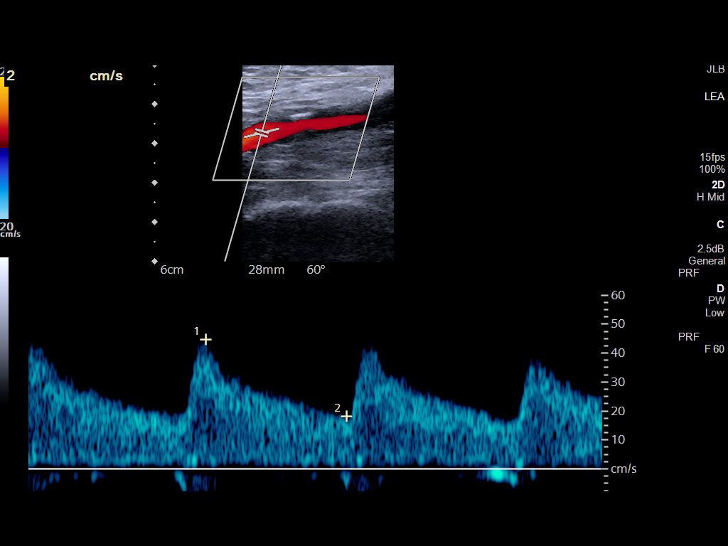
[im 8/46]
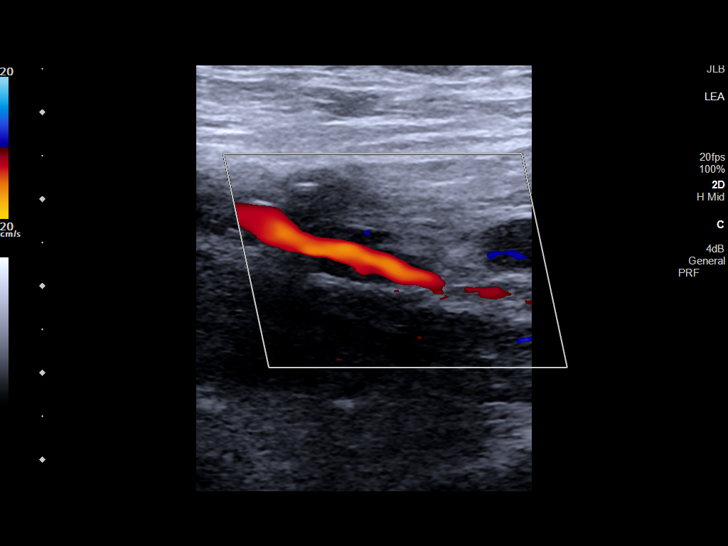
[im 12/46]
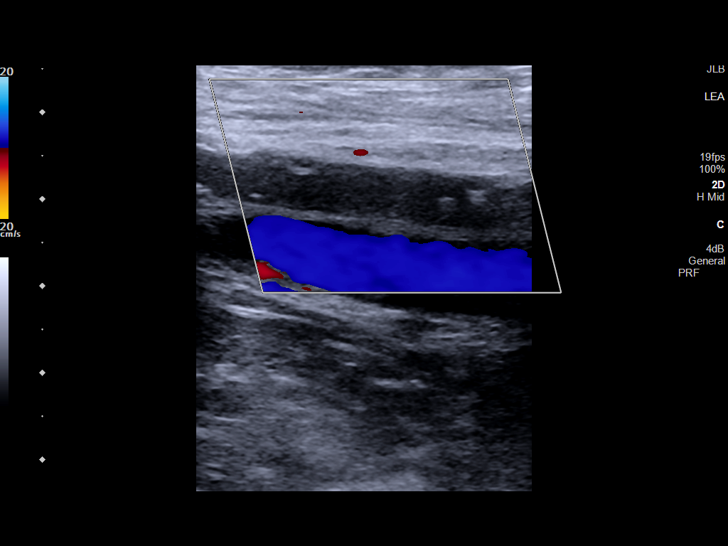
[im 16/46]
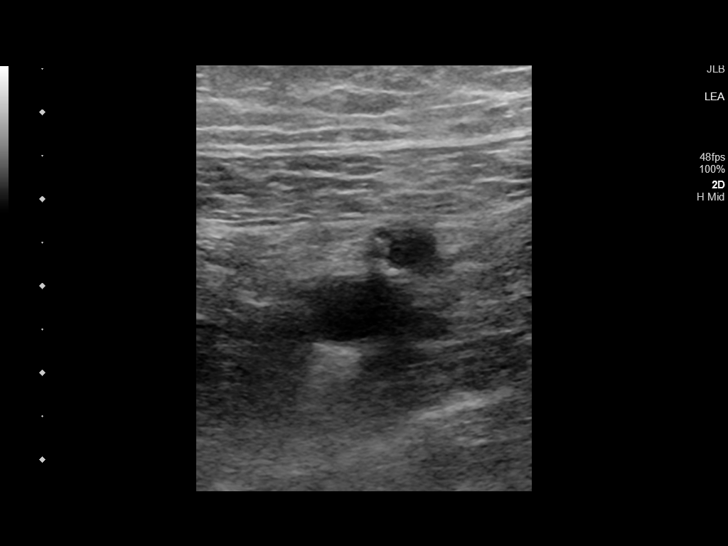
[im 17/46]
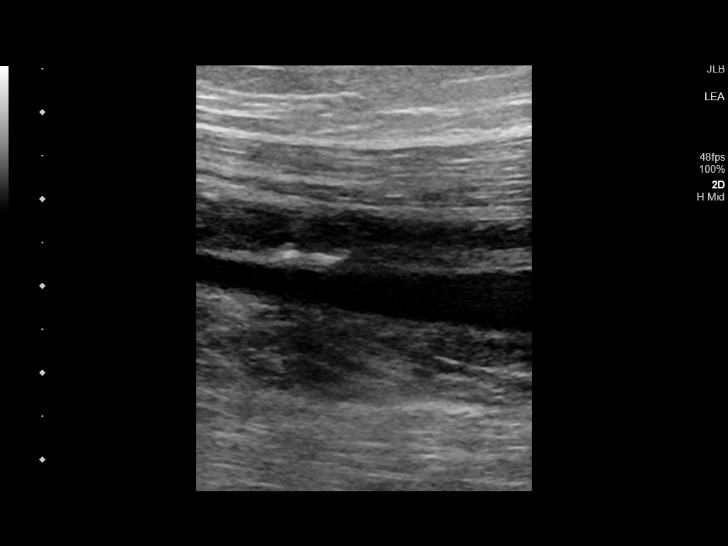
[im 21/46]
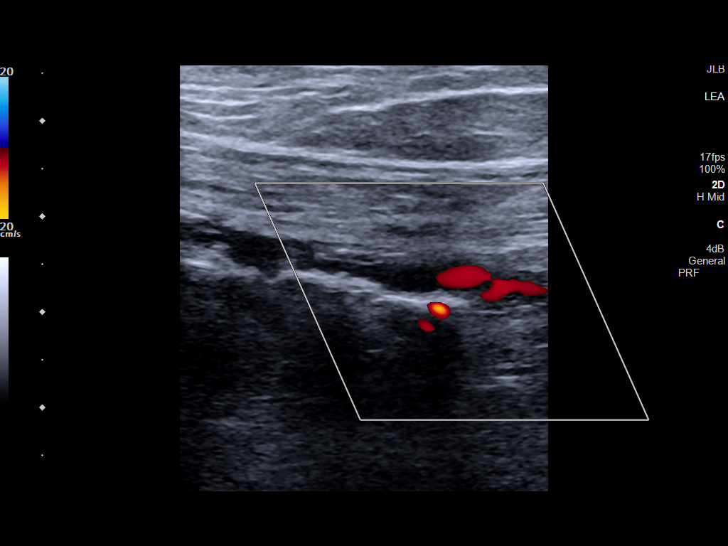
[im 25/46]
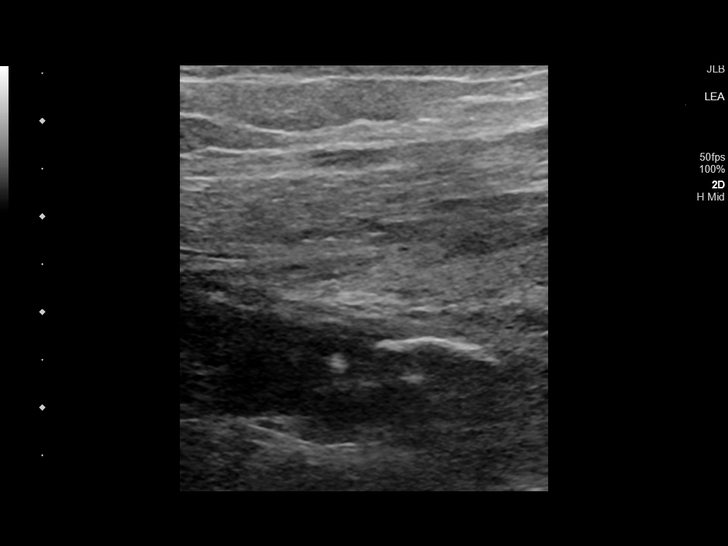
[im 29/46]
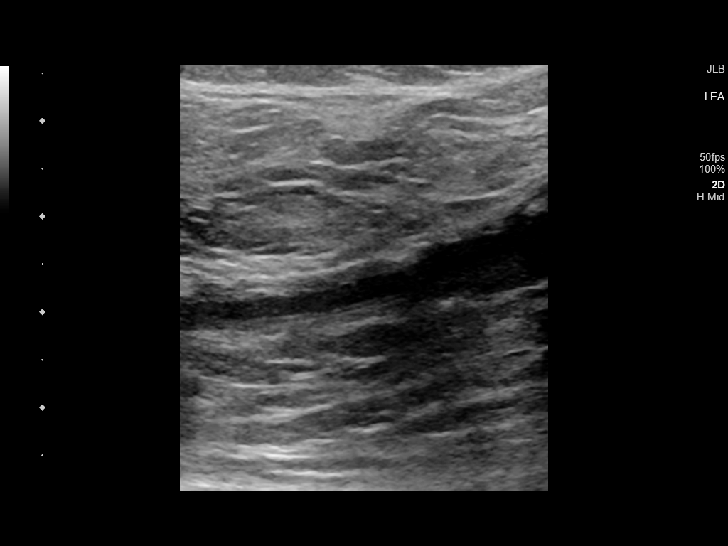
[im 31/46]
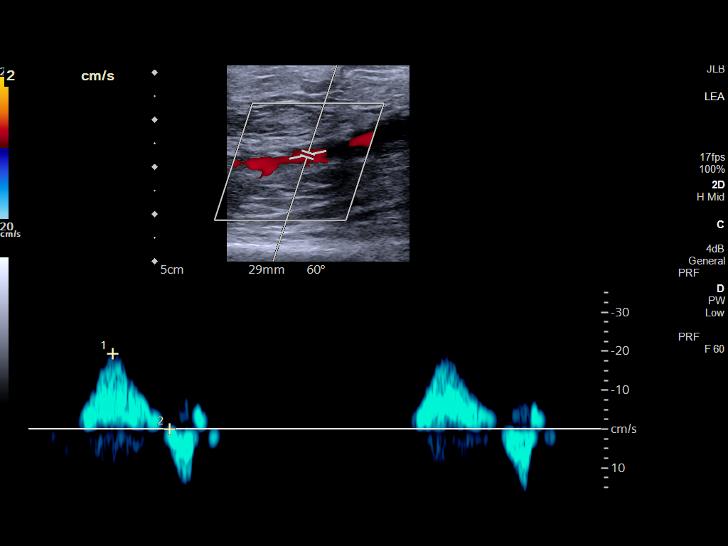
[im 34/46]
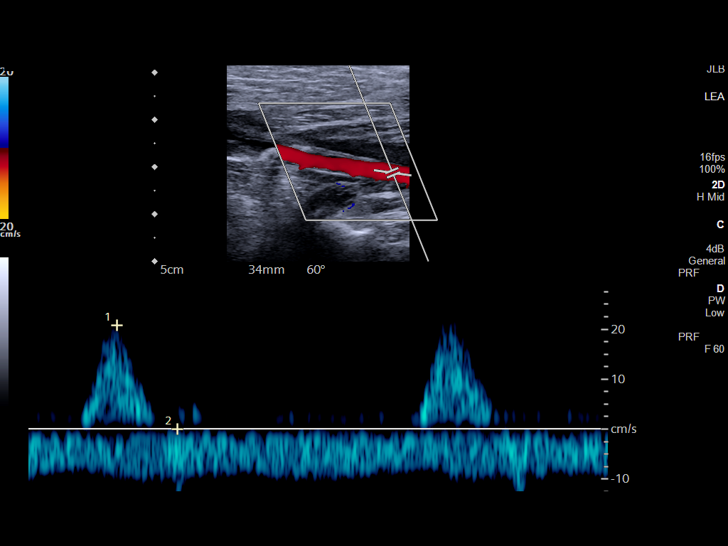
[im 38/46]
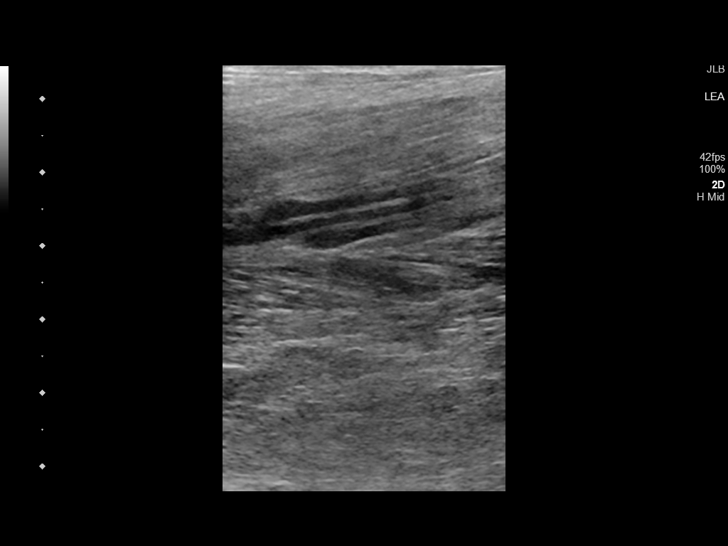
[im 42/46]
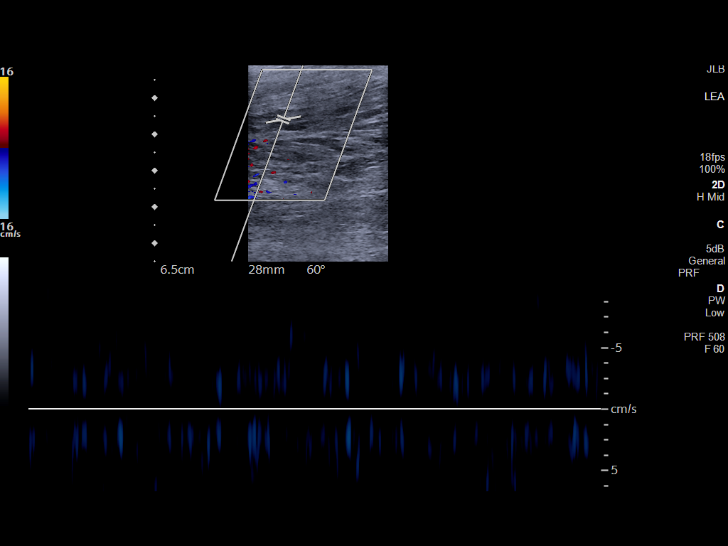
[im 46/46]
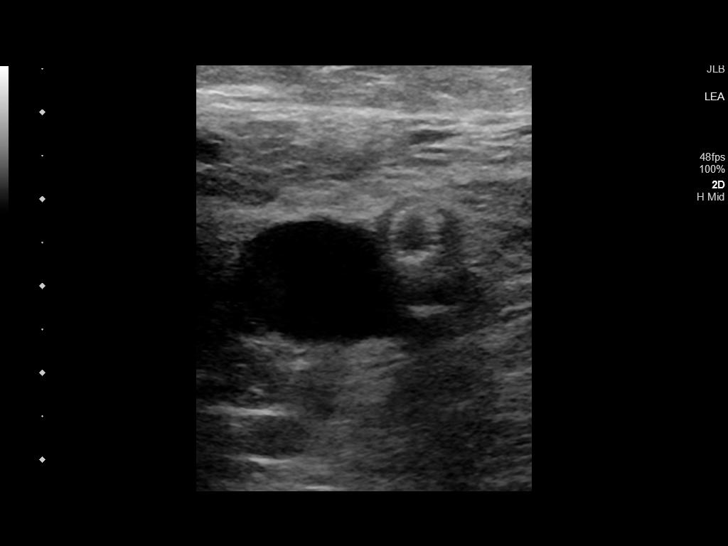

[14 of 25 positions shown; findings below may reference images not displayed]

FINDINGS: Right Lower Extremity

Directed duplex demonstrates monophasic waveform of common femoral
artery, profunda femoris.

SFA is occluded to the popliteal artery. Reconstitution of the
popliteal artery with monophasic waveform. Monophasic tibial
arteries.

Left Lower Extremity

Directed duplex left lower extremity demonstrates monophasic common
femoral artery
IMPRESSION: Directed duplex right lower extremity demonstrates monophasic common
femoral artery, occlusion of the SFA, and reconstitution of the
popliteal artery. Monophasic waveform of the tibial arteries.
